# Patient Record
Sex: Male | Born: 1940 | Race: White | Hispanic: No | Marital: Married | Smoking: Never smoker
Health system: Southern US, Community
[De-identification: ages and names within clinical notes are randomized; demographics above are authoritative.]

## PROBLEM LIST (undated history)

## (undated) DIAGNOSIS — S0501XA Injury of conjunctiva and corneal abrasion without foreign body, right eye, initial encounter: Secondary | ICD-10-CM

## (undated) DIAGNOSIS — M1990 Unspecified osteoarthritis, unspecified site: Secondary | ICD-10-CM

## (undated) DIAGNOSIS — E7800 Pure hypercholesterolemia, unspecified: Secondary | ICD-10-CM

## (undated) DIAGNOSIS — I619 Nontraumatic intracerebral hemorrhage, unspecified: Secondary | ICD-10-CM

## (undated) DIAGNOSIS — Z91018 Allergy to other foods: Secondary | ICD-10-CM

## (undated) DIAGNOSIS — C801 Malignant (primary) neoplasm, unspecified: Secondary | ICD-10-CM

## (undated) HISTORY — DX: Injury of conjunctiva and corneal abrasion without foreign body, right eye, initial encounter: S05.01XA

## (undated) HISTORY — DX: Unspecified osteoarthritis, unspecified site: M19.90

## (undated) HISTORY — PX: CIRCUMCISION: SHX1350

## (undated) HISTORY — DX: Allergy to other foods: Z91.018

---

## 1990-01-15 HISTORY — PX: EYE SURGERY: SHX253

## 2004-01-28 ENCOUNTER — Ambulatory Visit: Payer: Self-pay | Admitting: Internal Medicine

## 2004-01-28 ENCOUNTER — Ambulatory Visit (HOSPITAL_COMMUNITY): Admission: RE | Admit: 2004-01-28 | Discharge: 2004-01-28 | Payer: Self-pay | Admitting: Internal Medicine

## 2004-11-28 ENCOUNTER — Ambulatory Visit: Payer: Self-pay | Admitting: Internal Medicine

## 2006-09-03 IMAGING — CR DG HIP W/ PELVIS BILAT
2 series · 2 of 2 positions shown · non-contrast
Comparison: none

CLINICAL DATA: Left hip pain with no known injury.
 BILATERAL HIPS WITH AP PELVIS:
 Two views of each hip as well as AP view of the pelvis were performed.  Mild symmetric degenerative changes are seen involving the hip joints. No evidence of acute fracture or dislocation. No bony lesions or erosions are identified.  The visualized bony pelvis is unremarkable.

[view not recorded (1 of 2)]
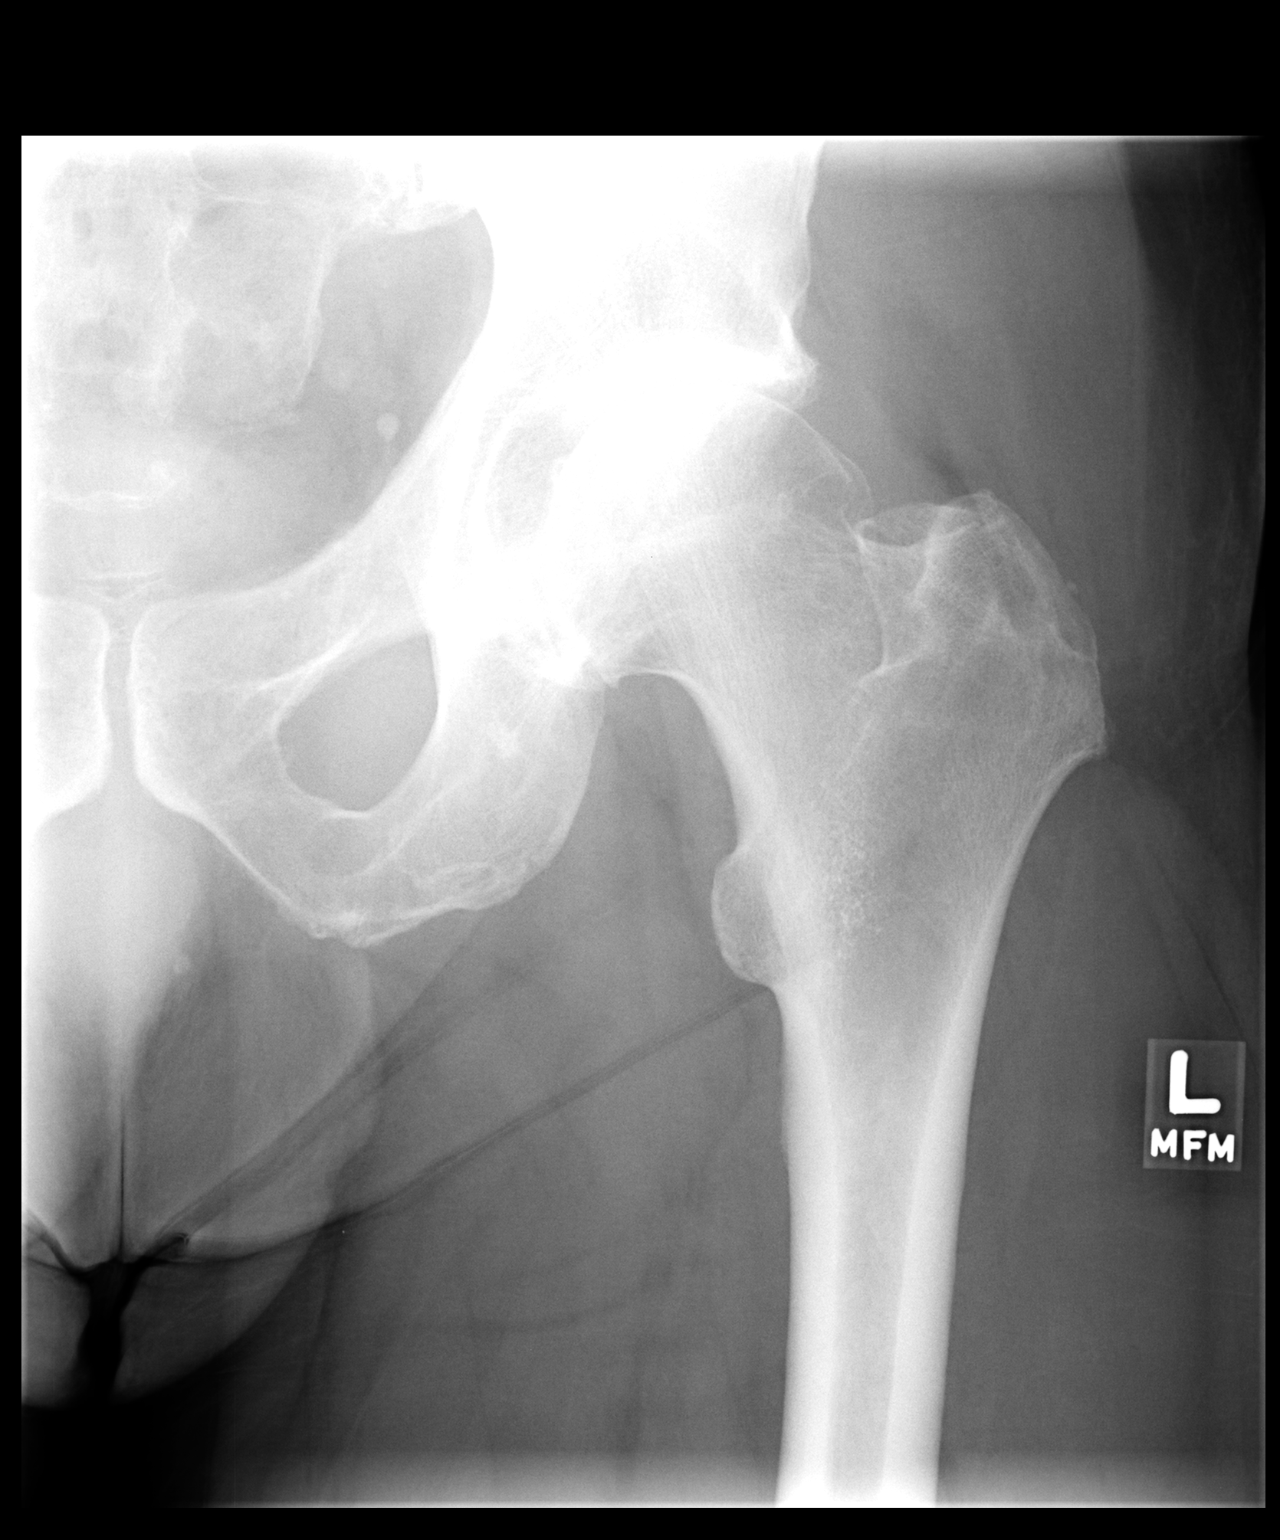

[view not recorded (2 of 2)]
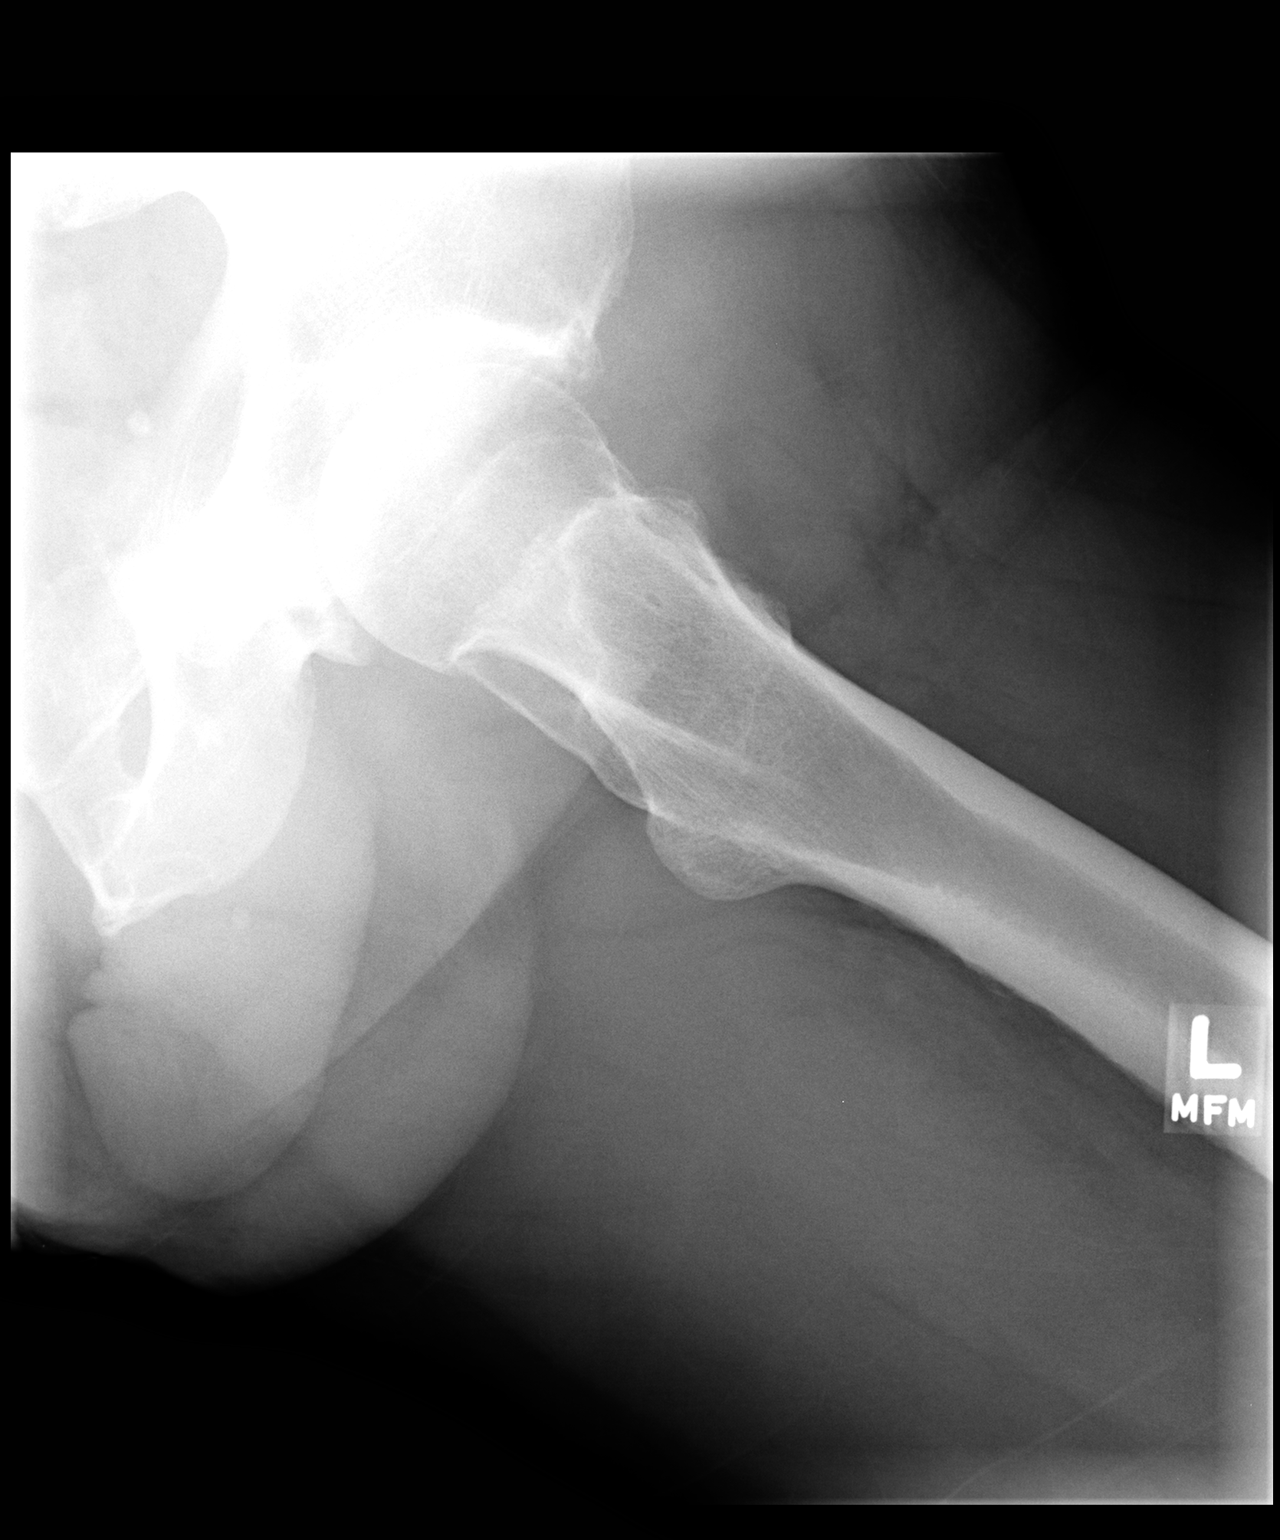

[2 of 2 positions shown; findings below may reference images not displayed]

IMPRESSION: Mild degenerative changes involving both hip joints. No acute abnormalities.

## 2006-09-03 IMAGING — CR DG HIP W/ PELVIS BILAT
3 series · 3 of 3 positions shown · non-contrast
Comparison: none

CLINICAL DATA: Left hip pain with no known injury.
 BILATERAL HIPS WITH AP PELVIS:
 Two views of each hip as well as AP view of the pelvis were performed.  Mild symmetric degenerative changes are seen involving the hip joints. No evidence of acute fracture or dislocation. No bony lesions or erosions are identified.  The visualized bony pelvis is unremarkable.

[view not recorded (1 of 3)]
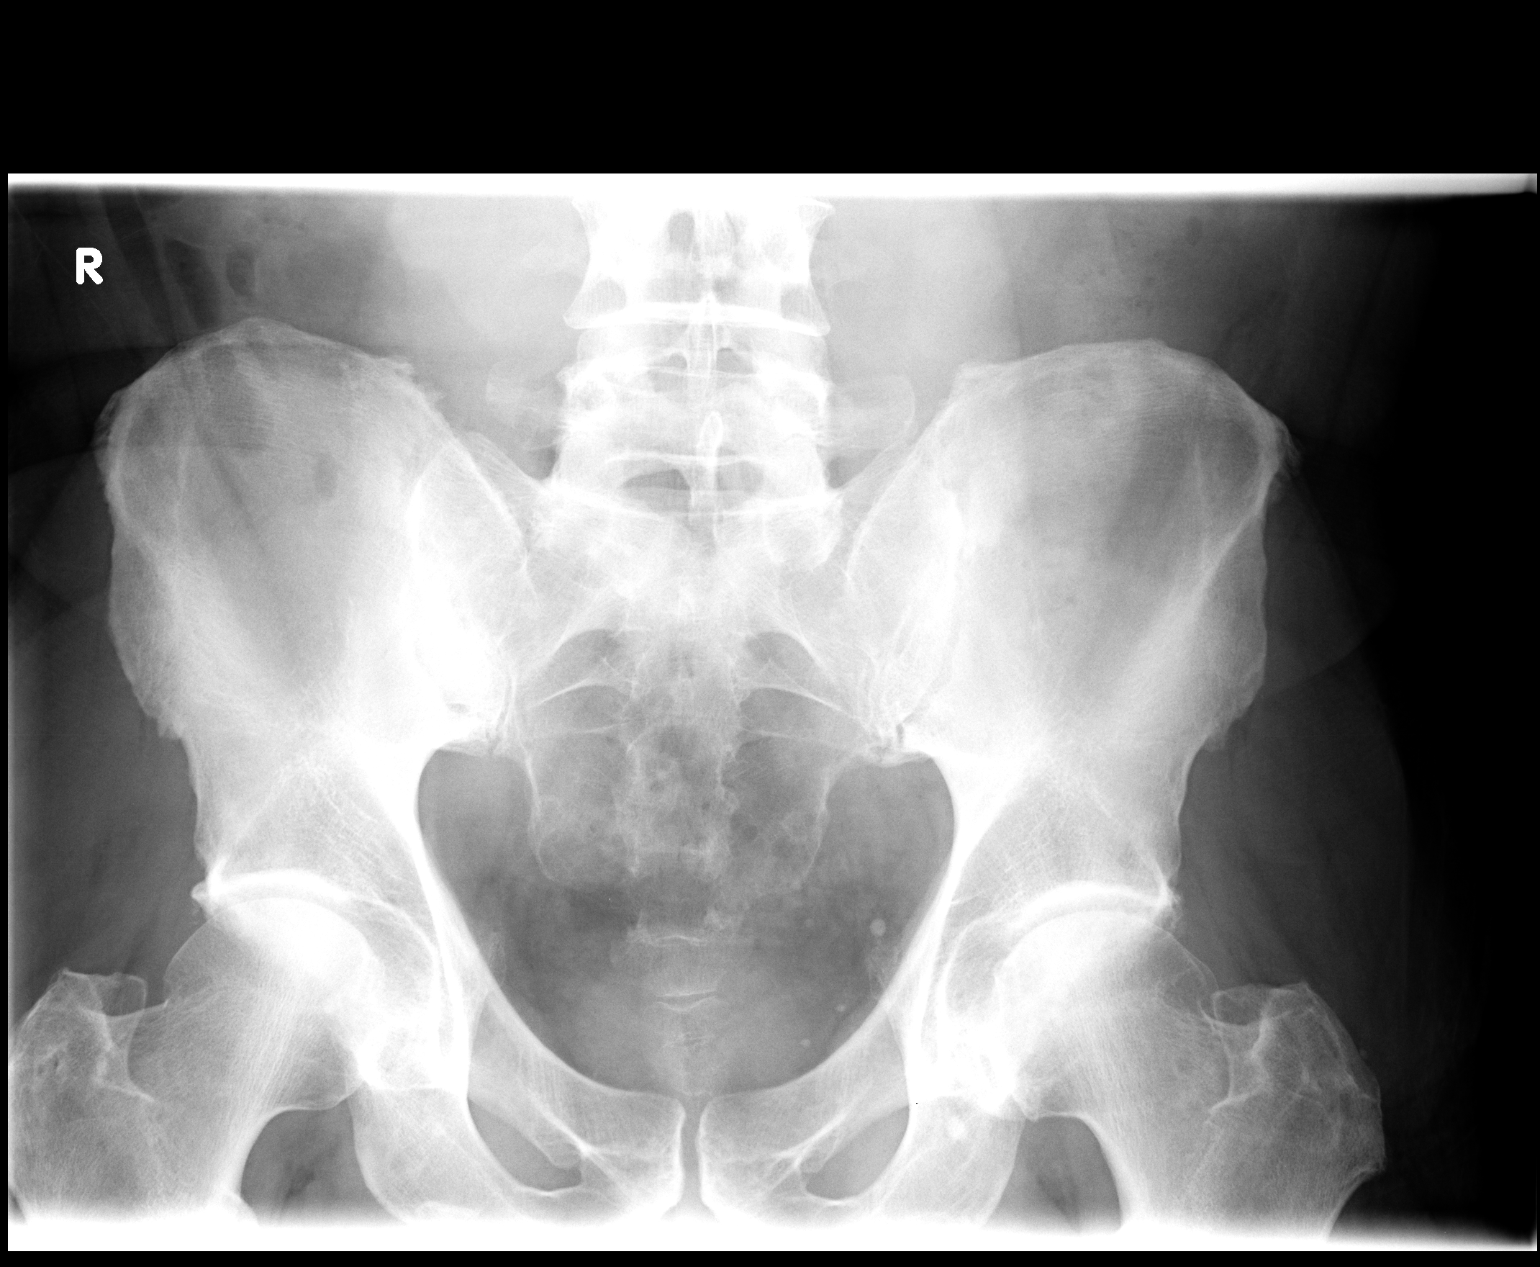

[view not recorded (2 of 3)]
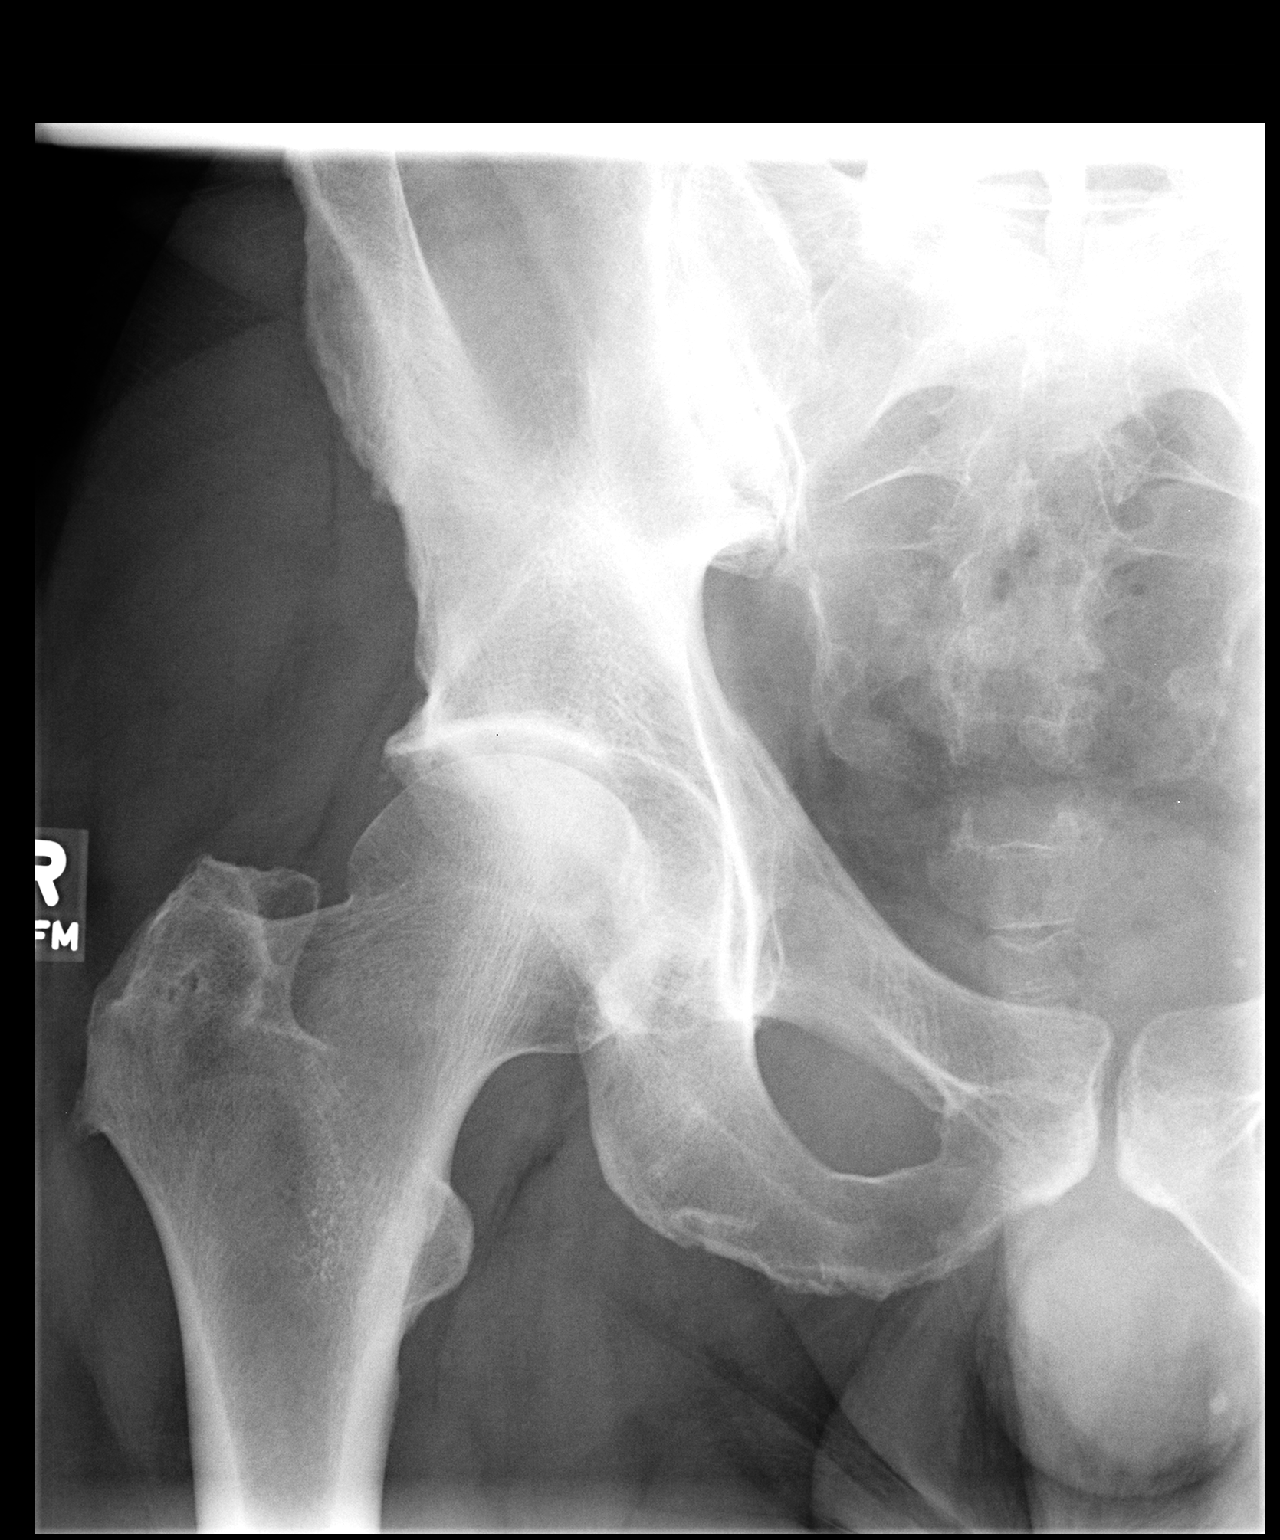

[view not recorded (3 of 3)]
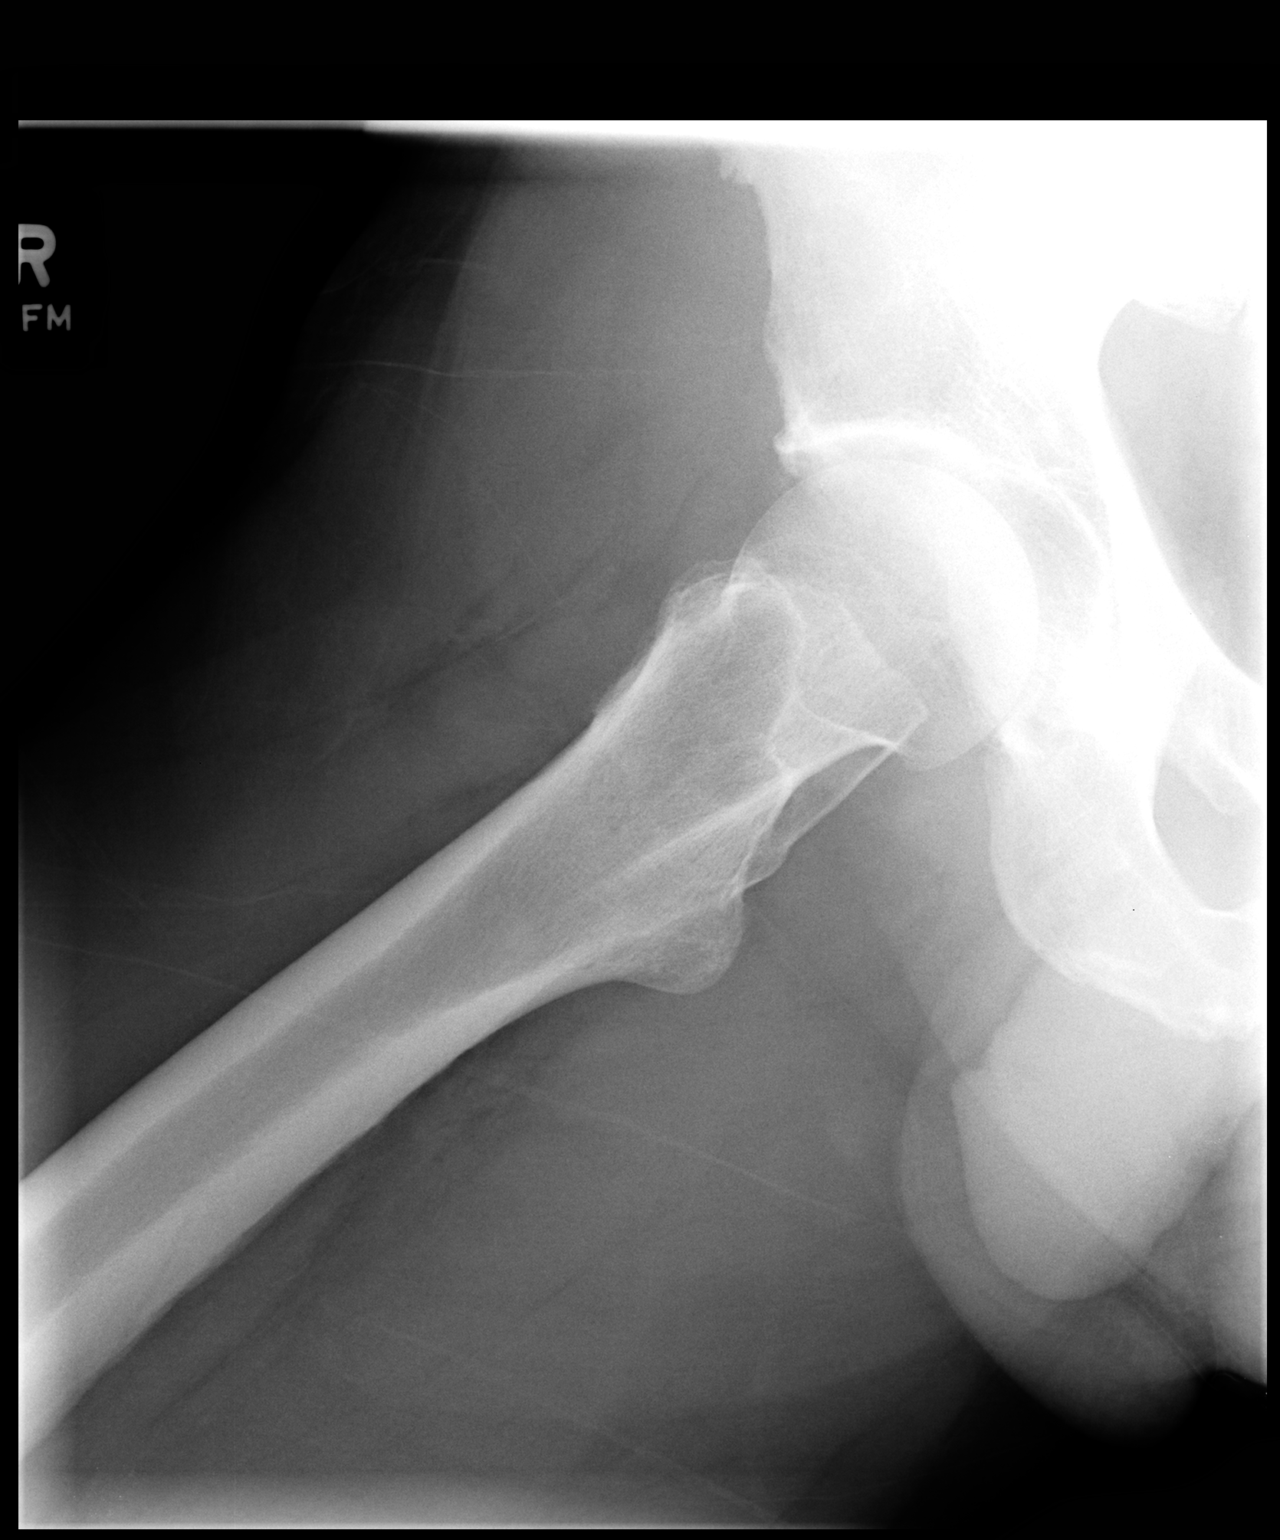

[3 of 3 positions shown; findings below may reference images not displayed]

IMPRESSION: Mild degenerative changes involving both hip joints. No acute abnormalities.

## 2010-01-15 DIAGNOSIS — S0501XA Injury of conjunctiva and corneal abrasion without foreign body, right eye, initial encounter: Secondary | ICD-10-CM

## 2010-01-15 HISTORY — DX: Injury of conjunctiva and corneal abrasion without foreign body, right eye, initial encounter: S05.01XA

## 2011-01-10 ENCOUNTER — Ambulatory Visit (INDEPENDENT_AMBULATORY_CARE_PROVIDER_SITE_OTHER): Payer: Medicare Other | Admitting: Internal Medicine

## 2011-01-10 ENCOUNTER — Other Ambulatory Visit (INDEPENDENT_AMBULATORY_CARE_PROVIDER_SITE_OTHER): Payer: Medicare Other

## 2011-01-10 ENCOUNTER — Other Ambulatory Visit: Payer: Self-pay | Admitting: Internal Medicine

## 2011-01-10 ENCOUNTER — Encounter: Payer: Self-pay | Admitting: Internal Medicine

## 2011-01-10 DIAGNOSIS — Z0000 Encounter for general adult medical examination without abnormal findings: Secondary | ICD-10-CM

## 2011-01-10 DIAGNOSIS — E785 Hyperlipidemia, unspecified: Secondary | ICD-10-CM

## 2011-01-10 DIAGNOSIS — M160 Bilateral primary osteoarthritis of hip: Secondary | ICD-10-CM

## 2011-01-10 DIAGNOSIS — Z125 Encounter for screening for malignant neoplasm of prostate: Secondary | ICD-10-CM

## 2011-01-10 DIAGNOSIS — N429 Disorder of prostate, unspecified: Secondary | ICD-10-CM

## 2011-01-10 DIAGNOSIS — M1610 Unilateral primary osteoarthritis, unspecified hip: Secondary | ICD-10-CM

## 2011-01-10 DIAGNOSIS — E559 Vitamin D deficiency, unspecified: Secondary | ICD-10-CM | POA: Insufficient documentation

## 2011-01-10 LAB — LIPID PANEL
Cholesterol: 248 mg/dL — ABNORMAL HIGH (ref 0–200)
HDL: 43.3 mg/dL (ref >39.00)
Total CHOL/HDL Ratio: 6
VLDL: 64.8 mg/dL — ABNORMAL HIGH (ref 0.0–40.0)

## 2011-01-10 LAB — CBC WITH DIFFERENTIAL/PLATELET
Basophils Relative: 0.6 % (ref 0.0–3.0)
Eosinophils Relative: 2.7 % (ref 0.0–5.0)
HCT: 40.9 % (ref 39.0–52.0)
Hemoglobin: 14 g/dL (ref 13.0–17.0)
Lymphs Abs: 2.3 10*3/uL (ref 0.7–4.0)
MCHC: 34.2 g/dL (ref 30.0–36.0)
MCV: 90.2 fl (ref 78.0–100.0)
Monocytes Absolute: 0.5 10*3/uL (ref 0.1–1.0)
Neutro Abs: 3.7 10*3/uL (ref 1.4–7.7)
Neutrophils Relative %: 55.3 % (ref 43.0–77.0)
RBC: 4.53 Mil/uL (ref 4.22–5.81)
WBC: 6.7 10*3/uL (ref 4.5–10.5)

## 2011-01-10 LAB — COMPREHENSIVE METABOLIC PANEL
ALT: 35 U/L (ref 0–53)
AST: 30 U/L (ref 0–37)
Alkaline Phosphatase: 58 U/L (ref 39–117)
BUN: 21 mg/dL (ref 6–23)
Creatinine, Ser: 1.2 mg/dL (ref 0.4–1.5)
Total Bilirubin: 1.4 mg/dL — ABNORMAL HIGH (ref 0.3–1.2)

## 2011-01-10 LAB — LDL CHOLESTEROL, DIRECT: Direct LDL: 117.1 mg/dL

## 2011-01-10 NOTE — Assessment & Plan Note (Signed)
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 He is able to do all his normal and desired activities. He reports that the pain is tolerable with the use of advil twice a day. Renal function is normal - no evidence of kidney damage from non-steroidal medication.  Plan - continue to be active - flexibility exercise and stretch           Continue with advil as presently dosed.

## 2011-01-10 NOTE — Progress Notes (Signed)
Subjective:    Patient ID: Eddie Walter, male    DOB: 10-Oct-1940, 70 y.o.   MRN: [id number]  HPI [name] presents to reestablish for primary care. He was last seen in '04 by dictated records, in '07 by his report. His chief complaint is ongoing hip pain. He does take  advil twice a day with some relief. He is otherwise healthy and does remain active physically.  The patient is here for annual Medicare wellness examination and management of other chronic and acute problems.   The risk factors are reflected in the social history.  The roster of all physicians providing medical care to patient - is listed in the Snapshot section of the chart.  Activities of daily living:  The patient is 100% inedpendent in all ADLs: dressing, toileting, feeding as well as independent mobility  Home safety : The patient has smoke detectors in the home. They wear seatbelts. firearms are present in the home, kept in a safe fashion. There is no violence in the home.   There is no risks for hepatitis, STDs or HIV. There is no   history of blood transfusion. They have no travel history to infectious disease endemic areas of the world.  The patient has seen their dentist in the last six month. They have seen their eye doctor in the last year. They admit to any hearing difficulty, mild on the right. Have not had audiologic testing in the last year.  They do not  have excessive sun exposure. Discussed the need for sun protection: hats, long sleeves and use of sunscreen if there is significant sun exposure.   Diet: the importance of a healthy diet is discussed. They do have a unhealthy-high fat/fast food at times (breakfast) diet, but does try to make healty choices.  The patient has a regular exercise program: elipitical and machines ,  45  duration,  3 per week.  The benefits of regular aerobic exercise were discussed.  Depression screen: there are no signs or vegative symptoms of depression- irritability,  change in appetite, anhedonia, sadness/tearfullness.  Cognitive assessment: the patient manages all their financial and personal affairs and is actively engaged. They could relate day,date,year and events.  The following portions of the patient's history were reviewed and updated as appropriate: allergies, current medications, past family history, past medical history,  past surgical history, past social history  and problem list.  Vision, hearing, body mass index were assessed and reviewed.   During the course of the visit the patient was educated and counseled about appropriate screening and preventive services including : fall prevention , diabetes screening, nutrition counseling, colorectal cancer screening, and recommended immunizations.  Past Medical History  Diagnosis Date  . 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 Corneal abrasion, right 2012    required prolonged treatment   Past Surgical History  Procedure Date  . Eye surgery 1992    cataract -bilateral  . Circumcision     adult @ 65   Family History  Problem Relation Age of Onset  . Heart disease Mother     CAD, bradycardia, PVD - leg amputation  . Asthma Mother   . Kidney disease Mother     nephrolithiasis  . Cancer Father     lung  . Cancer Sister     breast cancer - lumpectomy  . Diabetes Neg Hx   . Heart disease Brother     aneurysm  . Heart disease Brother  CAD/MI-fatal   History   Social History  . Marital Status: Married    Spouse Name: N/A    Number of Children: 2  . Years of Education: 16   Occupational History  . real Chief Technology Officer    Social History Main Topics  . Smoking status: Never Smoker   . Smokeless tobacco: Never Used  . Alcohol Use: 0.5 oz/week    1 drink(s) per week  . Drug Use: No  . Sexually Active: Yes -- Male partner(s)   Other Topics Concern  . Not on file   Social History Narrative   HSG, [employer] 4 years, BS Business degree [employer] - night school.  Married - '67. 2 sons - '69, '77; 2 grand-daughters. Work - Engineer, civil (consulting). Has had hard times in recent recession but is making a good comeback (Dec '12). Marriage in good health. Exercises on a regular basis.         Review of Systems Constitutional:  Negative for fever, chills, activity change and unexpected weight change.  HEENT:  Negative for hearing loss, ear pain, congestion, neck stiffness and postnasal drip. Negative for sore throat or swallowing problems. Negative for dental complaints.   Eyes: Negative for vision loss or change in visual acuity.  Respiratory: Negative for chest tightness and wheezing. Negative for DOE.   Cardiovascular: Negative for chest pain or palpitations. No decreased exercise tolerance Gastrointestinal: No change in bowel habit. No bloating or gas. No reflux or indigestion Genitourinary: Negative for urgency, frequency, flank pain and difficulty urinating.  Musculoskeletal: Negative for myalgias, back pain, arthralgias and gait problem.  Neurological: Negative for dizziness, tremors, weakness and headaches.  Hematological: Negative for adenopathy.  Psychiatric/Behavioral: Negative for behavioral problems and dysphoric mood.       Objective:   Physical Exam Vital signs reviewed-stable Gen'l: Well nourished well developed white male in no acute distress  HEENT: Head: Normocephalic and atraumatic. Right Ear: External ear normal. EAC/TM nl. Left Ear: External ear normal.  EAC/TM nl. Nose: Nose normal. Mouth/Throat: Oropharynx is clear and moist. Dentition - native, in good repair. No buccal or palatal lesions. Posterior pharynx clear. Eyes: Conjunctivae and sclera clear. EOM intact. Pupils are equal, round, and reactive to light. Right eye exhibits no discharge. Left eye exhibits no discharge. Neck: Normal range of motion. Neck supple. No JVD present. No tracheal deviation present. No thyromegaly present.  Cardiovascular: Normal rate, regular rhythm,  no gallop, no friction rub, no murmur heard.      Quiet precordium. 2+ radial and DP pulses . No carotid bruits Pulmonary/Chest: Effort normal. No respiratory distress or increased WOB, no wheezes, no rales. No chest wall deformity or CVAT. Abdominal: Soft. Bowel sounds are normal in all quadrants. He exhibits no distension, no tenderness, no rebound or guarding, No heptosplenomegaly  Genitourinary:  deferred to PSA order Musculoskeletal: Normal range of motion. He exhibits no edema and no tenderness.       Small and large joints without redness, synovial thickening or deformity. Full range of motion preserved about all small, median and large joints.  Lymphadenopathy:    He has no cervical or supraclavicular adenopathy.  Neurological: He is alert and oriented to person, place, and time. CN II-XII intact. DTRs 2+ and symmetrical biceps, radial and patellar tendons. Cerebellar function normal with no tremor, rigidity, normal gait and station.  Skin: Skin is warm and dry. No rash noted. No erythema.  Psychiatric: He has a normal mood and affect. His behavior is  normal. Thought content normal.   Lab Results  Component Value Date   WBC 6.7 01/10/2011   HGB 14.0 01/10/2011   HCT 40.9 01/10/2011   PLT 195.0 01/10/2011   GLUCOSE 108* 01/10/2011   CHOL 248* 01/10/2011   TRIG 324.0* 01/10/2011   HDL 43.30 01/10/2011   LDLDIRECT 117.1 01/10/2011   ALT 35 01/10/2011   AST 30 01/10/2011   NA 141 01/10/2011   K 4.7 01/10/2011   CL 105 01/10/2011   CREATININE 1.2 01/10/2011   BUN 21 01/10/2011   CO2 30 01/10/2011   TSH 1.02 01/10/2011   PSA 3.74 01/10/2011         Assessment & Plan:

## 2011-01-10 NOTE — Assessment & Plan Note (Signed)
Interval medical history since last visit is unremarkable. Physical exam is normal. Lab results are in normal range except for total cholesterol and triglycerides. No record of colorectal cancer screening. May have been done at [facility] - if not he is due for colonoscopy. Prostate cancer screening - normal PSA today. Immunization: he will check on status re: tetanus and shingles vaccine. He is due for flu vaccine.  In summary- a nice man who presents to reestablish for care. He is medically stable but is advised to work on diet management of cholesterol and to be sure we have current data on colorectal cancer screening and immunization status.

## 2011-01-10 NOTE — Assessment & Plan Note (Signed)
Lab Results  Component Value Date   CHOL 248* 01/10/2011   HDL 43.30 01/10/2011   LDLDIRECT 117.1 01/10/2011   TRIG 324.0* 01/10/2011   CHOLHDL 6 01/10/2011   Total cholesterol is high but LDL is better than goal of 130 or less; HDL is better than goal of 39 or more.   Plan - no indication for medical therapy           Life-style management with a low fat diet and continued exercise

## 2011-01-10 NOTE — Assessment & Plan Note (Signed)
Previously diagnosed. No risk factors for low D. [facility] blocks ordering routine follow-up lab.  Plan - continue over the counter supplement: [id number] iu daily

## 2012-02-12 ENCOUNTER — Ambulatory Visit (INDEPENDENT_AMBULATORY_CARE_PROVIDER_SITE_OTHER): Payer: Medicare Other | Admitting: Internal Medicine

## 2012-02-12 ENCOUNTER — Other Ambulatory Visit (INDEPENDENT_AMBULATORY_CARE_PROVIDER_SITE_OTHER): Payer: Medicare Other

## 2012-02-12 ENCOUNTER — Encounter: Payer: Self-pay | Admitting: Internal Medicine

## 2012-02-12 VITALS — BP 142/82 | HR 47 | Temp 98.3°F | Ht 70.0 in | Wt 208.2 lb

## 2012-02-12 DIAGNOSIS — M160 Bilateral primary osteoarthritis of hip: Secondary | ICD-10-CM

## 2012-02-12 DIAGNOSIS — M1610 Unilateral primary osteoarthritis, unspecified hip: Secondary | ICD-10-CM

## 2012-02-12 DIAGNOSIS — Z136 Encounter for screening for cardiovascular disorders: Secondary | ICD-10-CM

## 2012-02-12 DIAGNOSIS — Z0000 Encounter for general adult medical examination without abnormal findings: Secondary | ICD-10-CM

## 2012-02-12 DIAGNOSIS — Z23 Encounter for immunization: Secondary | ICD-10-CM

## 2012-02-12 LAB — COMPREHENSIVE METABOLIC PANEL
ALT: 32 U/L (ref 0–53)
CO2: 27 mEq/L (ref 19–32)
Calcium: 9.4 mg/dL (ref 8.4–10.5)
Chloride: 106 mEq/L (ref 96–112)
Creatinine, Ser: 1.1 mg/dL (ref 0.4–1.5)
GFR: 71.08 mL/min (ref >60.00)
Glucose, Bld: 98 mg/dL (ref 70–99)

## 2012-02-12 NOTE — Progress Notes (Signed)
Subjective:    Patient ID: CID AGENA, male    DOB: [birth year], 72 y.o.   MRN: [id number]  HPI The patient is here for annual Medicare wellness examination and management of other chronic and acute problems. Doing well in the last year: no major illness, no surgery or injury.   The risk factors are reflected in the social history.  The roster of all physicians providing medical care to patient - is listed in the Snapshot section of the chart.  Activities of daily living:  The patient is 100% inedpendent in all ADLs: dressing, toileting, feeding as well as independent mobility  Home safety : The patient has smoke detectors in the home. Fall- home is fall safe. They wear seatbelts. firearms are present in the home, kept in a safe fashion. There is no violence in the home.   There is no risks for hepatitis, STDs or HIV. There is no  history of blood transfusion. They have no travel history to infectious disease endemic areas of the world.  The patient has  seen their dentist in the last six month. They have not seen their eye doctor in the last year. They admit to hearing difficulty and have not had audiologic testing in the last year.    They do not  have excessive sun exposure. Discussed the need for sun protection: hats, long sleeves and use of sunscreen if there is significant sun exposure.   Diet: the importance of a healthy diet is discussed. They do have a healthy  diet.  The patient has a regular exercise program: ellipitcal/weight work , 60-75 min duration, 5 per week.  The benefits of regular aerobic exercise were discussed.  Depression screen: there are no signs or vegative symptoms of depression- irritability, change in appetite, anhedonia, sadness/tearfullness.  Cognitive assessment: the patient manages all their financial and personal affairs and is actively engaged.   Past Medical History  Diagnosis Date  . 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 Corneal abrasion,  right 2012    required prolonged treatment   Past Surgical History  Procedure Date  . Eye surgery 1992    cataract -bilateral  . Circumcision     adult @ 89   Family History  Problem Relation Age of Onset  . Heart disease Mother     CAD, bradycardia, PVD - leg amputation  . Asthma Mother   . Kidney disease Mother     nephrolithiasis  . Cancer Father     lung  . Cancer Sister     breast cancer - lumpectomy  . Diabetes Neg Hx   . Heart disease Brother     aneurysm  . Heart disease Brother     CAD/MI-fatal   History   Social History  . Marital Status: Married    Spouse Name: N/A    Number of Children: 2  . Years of Education: 16   Occupational History  . real Chief Technology Officer    Social History Main Topics  . Smoking status: Never Smoker   . Smokeless tobacco: Never Used  . Alcohol Use: 0.5 oz/week    1 drink(s) per week  . Drug Use: No  . Sexually Active: Yes -- Male partner(s)   Other Topics Concern  . Not on file   Social History Narrative   HSG, [employer] 4 years, BS Business degree [employer] - night school. Married - '67. 2 sons - '69, '77; 2 grand-daughters. 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 Has had hard times in recent recession but is making a good comeback (Dec '12). Marriage in good health. Exercises on a regular basis.     Current Outpatient Prescriptions on File Prior to Visit  Medication Sig Dispense Refill  . Cyanocobalamin (VITAMIN B-12 CR) 1000 MCG TBCR Take 1 tablet by mouth daily.        . fish oil-omega-3 fatty acids 1000 MG capsule Take 2 g by mouth daily.        Marland Kitchen glucosamine-chondroitin 500-400 MG tablet Take 1 tablet by mouth 2 (two) times daily.        . Naproxen Sodium (ALEVE) 220 MG CAPS Take 1 capsule by mouth 2 (two) times daily.        . Nutritional Supplements (VITAMIN D MAINTENANCE PO) Take 1 tablet by mouth daily.          The following portions of the patient's history were reviewed and updated as appropriate:  allergies, current medications, past family history, past medical history,  past surgical history, past social history  and problem list.  Vision, hearing, body mass index were assessed and reviewed.   During the course of the visit the patient was educated and counseled about appropriate screening and preventive services including : fall prevention , diabetes screening, nutrition counseling, colorectal cancer screening, and recommended immunizations.    Review of Systems Constitutional:  Negative for fever, chills, activity change and unexpected weight change.  HEENT:  Negative for hearing loss, ear pain, congestion, neck stiffness and postnasal drip. Negative for sore throat or swallowing problems. Negative for dental complaints.   Eyes: Negative for vision loss or change in visual acuity.  Respiratory: Negative for chest tightness and wheezing. Negative for DOE.   Cardiovascular: Negative for chest pain or palpitations. No decreased exercise tolerance Gastrointestinal: No change in bowel habit. No bloating or gas. No reflux or indigestion Genitourinary: Negative for urgency, frequency, flank pain and difficulty urinating.  Musculoskeletal: Negative for myalgias, back pain, arthralgias and gait problem.  Neurological: Negative for dizziness, tremors, weakness and headaches.  Hematological: Negative for adenopathy.  Psychiatric/Behavioral: Negative for behavioral problems and dysphoric mood.       Objective:   Physical Exam Filed Vitals:   02/12/12 1403  BP: 142/82  Pulse: 47  Temp: 98.3 F (36.8 C)   Wt Readings from Last 3 Encounters:  02/12/12 208 lb 3.2 oz (94.439 kg)  01/10/11 211 lb (95.709 kg)   Gen'l: Well nourished well developed     male in no acute distress  HEENT: Head: Normocephalic and atraumatic. Right Ear: External ear normal. EAC/TM nl. Left Ear: External ear normal.  EAC/TM nl. Nose: Nose normal. Mouth/Throat: Oropharynx is clear and moist. Dentition - native,  in good repair. No buccal or palatal lesions. Posterior pharynx clear. Eyes: Conjunctivae and sclera clear. EOM intact. Pupils are equal, round, and reactive to light. Right eye exhibits no discharge. Left eye exhibits no discharge. Neck: Normal range of motion. Neck supple. No JVD present. No tracheal deviation present. No thyromegaly present.  Cardiovascular: Normal rate, regular rhythm, no gallop, no friction rub, no murmur heard.      Quiet precordium. 2+ radial and DP pulses . No carotid bruits Pulmonary/Chest: Effort normal. No respiratory distress or increased WOB, no wheezes, no rales. No chest wall deformity or CVAT. Abdomen: Soft. Bowel sounds are normal in all quadrants. He exhibits no distension, no tenderness, no rebound or guarding, No heptosplenomegaly  Genitourinary:   Musculoskeletal: Normal range  of motion. He exhibits no edema and no tenderness.       Small and large joints without redness, synovial thickening or deformity. Full range of motion preserved about all small, median and large joints.  Lymphadenopathy:    He has no cervical or supraclavicular adenopathy.  Neurological: He is alert and oriented to person, place, and time. CN II-XII intact. DTRs 2+ and symmetrical biceps, radial and patellar tendons. Cerebellar function normal with no tremor, rigidity, normal gait and station.  Skin: Skin is warm and dry. No rash noted. No erythema.  Psychiatric: He has a normal mood and affect. His behavior is normal. Thought content normal.         Assessment & Plan:

## 2012-02-12 NOTE — Patient Instructions (Addendum)
Thanks for coming in.  Your exam is good.  Please check on the colonoscopy report as to the type of polyp so we can schedule appropriate follow up  Limited lab today. Your cholesterol has been stable and the LDL was 117 which is in normal range.  Check on coverage for shingles vaccine and check on the date of your last tetanus.  Come back in a year, sooner if needed.

## 2012-11-20 ENCOUNTER — Other Ambulatory Visit: Payer: Self-pay

## 2013-04-22 NOTE — Assessment & Plan Note (Signed)
Normal medicare wellness exam.

## 2014-07-12 ENCOUNTER — Other Ambulatory Visit: Payer: Self-pay

## 2015-09-14 ENCOUNTER — Encounter (HOSPITAL_COMMUNITY): Payer: Self-pay

## 2015-09-14 ENCOUNTER — Emergency Department (HOSPITAL_COMMUNITY)
Admission: EM | Admit: 2015-09-14 | Discharge: 2015-09-14 | Disposition: A | Discharge status: Home / Self Care | Payer: Medicare Other | Attending: Emergency Medicine | Admitting: Emergency Medicine

## 2015-09-14 DIAGNOSIS — R220 Localized swelling, mass and lump, head: Secondary | ICD-10-CM | POA: Diagnosis not present

## 2015-09-14 MED ORDER — PREDNISONE 10 MG (21) PO TBPK: 10.0000 mg | ORAL_TABLET | Freq: Every day | ORAL | 0 refills | Status: DC

## 2015-09-14 NOTE — Discharge Instructions (Signed)
Please call your primary doctor at the [place] to schedule a follow-up.   If you are concerned about swelling of the face or in the mouth again, or have troubles breathing, call 911 or return to the ED.  I have prescribed a course of prednisone of decreasing dose for the next 12 days.  Please take as directed.  Also, continue to take Benadryl 25 mg every 6 hours and for the next 2 days.

## 2015-09-14 NOTE — ED Provider Notes (Signed)
[facility] DEPT Provider Note   CSN: [id number] Arrival date & time: 09/14/15  1457     History   Chief Complaint Chief Complaint  Patient presents with  . Oral Swelling    HP Eddie Walter is a healthy 75 y.o. male who presents after an episode of tongue swelling this afternoon that did not compromise his breathing or swallowing.  This morning, about 2 hours prior to presentation he noticed his tongue being to swell, with a hard lump on the left side of his tongue.  He did not have any dyspnea, dysphagia, drooling, or change in voice.  He used an EpiPen, took a benadryl, and took 1x 4 mg prednisone he had left over from his last episode of swelling.  No new meds, supplements, or foods.  He has had 2 prior episodes of tongue swelling, once last month and once 10 months ago.  Associated perioral edema previously.  HPI  Past Medical History:  Diagnosis Date  . Arthritis '07   hip DJD by x-ray  . Corneal abrasion, right 2012   required prolonged treatment    Patient Active Problem List   Diagnosis Date Noted  . Hyperlipidemia 01/10/2011  . 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 Vitamin D deficiency 01/10/2011  . Routine health maintenance 01/10/2011    Past Surgical History:  Procedure Laterality Date  . CIRCUMCISION     adult @ 4  . EYE SURGERY  1992   cataract -bilateral       Home Medications    Prior to Admission medications   Medication Sig Start Date End Date Taking? Authorizing Provider  Cyanocobalamin (VITAMIN B-12 CR) 1000 MCG TBCR Take 1 tablet by mouth daily.      Historical Provider, MD  fish oil-omega-3 fatty acids 1000 MG capsule Take 2 g by mouth daily.      Historical Provider, MD  glucosamine-chondroitin 500-400 MG tablet Take 1 tablet by mouth 2 (two) times daily.      Historical Provider, MD  Naproxen Sodium (ALEVE) 220 MG CAPS Take 1 capsule by mouth 2 (two) times daily.      Historical Provider, MD  Nutritional  Supplements (VITAMIN D MAINTENANCE PO) Take 1 tablet by mouth daily.      Historical Provider, MD    Family History Family History  Problem Relation Age of Onset  . Heart disease Mother     CAD, bradycardia, PVD - leg amputation  . Asthma Mother   . Kidney disease Mother     nephrolithiasis  . Cancer Father     lung  . Cancer Sister     breast cancer - lumpectomy  . Heart disease Brother     aneurysm  . Heart disease Brother     CAD/MI-fatal  . Diabetes Neg Hx     Social History Social History  Substance Use Topics  . Smoking status: Never Smoker  . Smokeless tobacco: Never Used  . Alcohol use 0.5 oz/week    1 Standard drinks or equivalent per week     Allergies   Review of patient's allergies indicates no known allergies.  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  Review of Systems Review of Systems  Constitutional: Negative for fever.  HENT: Positive for facial swelling. Negative for drooling, trouble swallowing and voice change.   Respiratory: Negative for cough, chest tightness, shortness of breath and wheezing.   Cardiovascular: Negative for chest pain and palpitations.  Gastrointestinal: Negative for  abdominal pain, diarrhea, nausea and vomiting.  Genitourinary: Negative for dysuria and hematuria.  Musculoskeletal: Negative for arthralgias and myalgias.  Skin: Negative for rash.  Allergic/Immunologic: Positive for food allergies.       Blood test indicating pork and beef allergies?  Neurological: Negative for weakness and headaches.     Physical Exam Updated Vital Signs BP 147/79 (BP Location: Right Arm)   Pulse 98   Temp 97.6 F (36.4 C) (Oral)   Resp 20   Ht 5\' 10"  (1.778 m)   Wt 88.9 kg   SpO2 97%   BMI 28.12 kg/m   Physical Exam  Constitutional: He is oriented to person, place, and time. He appears well-developed and well-nourished. No distress.  HENT:  No lingual or pharyngeal edema.  No lesions or erythema.  Eyes:  Conjunctivae are normal. No scleral icterus.  Neck: Normal range of motion. Neck supple.  Cardiovascular: Normal rate and regular rhythm.   Pulmonary/Chest: Effort normal and breath sounds normal. No stridor. No respiratory distress.  Abdominal: Soft. He exhibits no distension. There is no tenderness.  Musculoskeletal: He exhibits no edema or deformity.  Lymphadenopathy:    He has no cervical adenopathy.  Neurological: He is alert and oriented to person, place, and time.  Skin: Skin is warm and dry. No rash noted.  Psychiatric: He has a normal mood and affect. His behavior is normal.     ED Treatments / Results  Labs (all labs ordered are listed, but only abnormal results are displayed) Labs Reviewed - No data to display  EKG  EKG Interpretation None       Radiology No results found.  Procedures Procedures (including critical care time)  Medications Ordered in ED Medications - No data to display   Initial Impression / Assessment and Plan / ED Course  I have reviewed the triage vital signs and the nursing notes.  Pertinent labs & imaging results that were available during my care of the patient were reviewed by me and considered in my medical decision making (see chart for details).  Patient with history of episodic facial/oral swelling concerning for angioedema.  Given the rapid onset and resolution of symptoms, other causes of edema such as infection, autoimmune disease, and irritant dermatitis/mucositis are unlikely.   Symptoms resolved by ED presentation after home epinephrine, diphenhydramine, and small dose of prednisone.  No airway threat.  -Observe for rebound edema  Clinical Course     Final Clinical Impressions(s) / ED Diagnoses   Final diagnoses:  Tongue swelling   Discharge home with return precautions -PO prednisone taper -Has Epipen at home -Continue Q6H benadryl for next 2 days -Follow-up with PCP at [facility] Prescriptions Discharge Medication  List as of 09/14/2015  4:29 PM    START taking these medications   Details  predniSONE (STERAPRED UNI-PAK 21 TAB) 10 MG (21) TBPK tablet Take 1 tablet (10 mg total) by mouth daily. Take 6 tabs by mouth daily  for 2 days, then 5 tabs for 2 days, then 4 tabs for 2 days, then 3 tabs for 2 days, 2 tabs for 2 days, then 1 tab by mouth daily for 2 days, Starting Wed 09/14/2015, Print         Minus Liberty, MD 09/14/15 Baker, DO 09/15/15 [phone]

## 2015-09-14 NOTE — ED Triage Notes (Signed)
Per Pt, Pt is coming from home with complaints of oral swelling. Pt had an episode of oral swelling from an unknown source today. Pt took Benadryl and medication given by MD without relief. Called MD and MD advised to take Epipen. Pt reports HX of facial swelling and one episode of oral swelling. Alert and oriented x4 upon arrival. Easily swallowing secretions.

## 2015-10-06 ENCOUNTER — Encounter (HOSPITAL_COMMUNITY): Payer: Self-pay | Admitting: Emergency Medicine

## 2015-10-06 ENCOUNTER — Emergency Department (HOSPITAL_COMMUNITY)
Admission: EM | Admit: 2015-10-06 | Discharge: 2015-10-07 | Disposition: A | Discharge status: Home / Self Care | Payer: Medicare Other | Attending: Emergency Medicine | Admitting: Emergency Medicine

## 2015-10-06 DIAGNOSIS — R220 Localized swelling, mass and lump, head: Secondary | ICD-10-CM | POA: Insufficient documentation

## 2015-10-06 DIAGNOSIS — T7849XA Other allergy, initial encounter: Secondary | ICD-10-CM | POA: Insufficient documentation

## 2015-10-06 DIAGNOSIS — Z7982 Long term (current) use of aspirin: Secondary | ICD-10-CM | POA: Insufficient documentation

## 2015-10-06 DIAGNOSIS — T7840XA Allergy, unspecified, initial encounter: Secondary | ICD-10-CM

## 2015-10-06 MED ORDER — LORATADINE 10 MG PO TABS: 10.0000 mg | ORAL_TABLET | Freq: Every day | ORAL | 0 refills | Status: DC

## 2015-10-06 MED ORDER — FAMOTIDINE 20 MG PO TABS: 20.0000 mg | ORAL_TABLET | Freq: Two times a day (BID) | ORAL | 0 refills | Status: DC

## 2015-10-06 MED ORDER — LORATADINE 10 MG PO TABS
10.0000 mg | ORAL_TABLET | Freq: Once | ORAL | Status: AC
  Administered 2015-10-07: 10 mg via ORAL
  Filled 2015-10-06: qty 1

## 2015-10-06 MED ORDER — PREDNISONE 10 MG PO TABS: 20.0000 mg | ORAL_TABLET | Freq: Every day | ORAL | 0 refills | Status: DC

## 2015-10-06 MED ORDER — FAMOTIDINE 20 MG PO TABS
20.0000 mg | ORAL_TABLET | Freq: Once | ORAL | Status: AC
  Administered 2015-10-07: 20 mg via ORAL
  Filled 2015-10-06: qty 1

## 2015-10-06 MED ORDER — PREDNISONE 20 MG PO TABS
60.0000 mg | ORAL_TABLET | Freq: Once | ORAL | Status: AC
  Administered 2015-10-07: 60 mg via ORAL
  Filled 2015-10-06: qty 3

## 2015-10-06 NOTE — ED Provider Notes (Signed)
[facility] DEPT Provider Note   CSN: [id number] Arrival date & time: 10/06/15  1604     History   Chief Complaint Chief Complaint  Patient presents with  . Allergic Reaction    HPI Eddie Walter is a 75 y.o. male.  HPI   Patient has PMH of allergic reaction, arthritis, corneal abrasion. He reports not knowing what he is allergic too and today have an episode of an area of swelling to his mouth and tongue at home. He rook some benadryl and then called his PCP who advised him to use his Epi Pen. His epi pen was used around 1515, he then came to the ER. He reports complete resolution of symptoms within 2 hours. He has been in the ER for 7 hours and is symptom free.  Denies having had rash, diarrhea, vomiting, wheezing, SOB, or CP. He reports having 3 epi pens left at home if he needs.  Past Medical History:  Diagnosis Date  . Arthritis '07   hip DJD by x-ray  . Corneal abrasion, right 2012   required prolonged treatment    Patient Active Problem List   Diagnosis Date Noted  . Hyperlipidemia 01/10/2011  . 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 Vitamin D deficiency 01/10/2011  . Routine health maintenance 01/10/2011    Past Surgical History:  Procedure Laterality Date  . CIRCUMCISION     adult @ 5  . EYE SURGERY  1992   cataract -bilateral    Home Medications    Prior to Admission medications   Medication Sig Start Date End Date Taking? Authorizing Provider  aspirin EC 81 MG tablet Take 81 mg by mouth daily.   Yes Historical Provider, MD  cetirizine (ZYRTEC) 10 MG tablet Take 10 mg by mouth daily.   Yes Historical Provider, MD  diphenhydrAMINE (BENADRYL) 25 MG tablet Take 25 mg by mouth every 6 (six) hours as needed for allergies.   Yes Historical Provider, MD  EPINEPHrine 0.3 mg/0.3 mL IJ SOAJ injection Inject 0.3 mg into the muscle as needed (for allergic reaction).  09/27/15  Yes Historical Provider, MD  glucosamine-chondroitin 500-400 MG  tablet Take 1 tablet by mouth 2 (two) times daily.     Yes Historical Provider, MD  famotidine (PEPCID) 20 MG tablet Take 1 tablet (20 mg total) by mouth 2 (two) times daily. 10/06/15   Candia Kingsbury Carlota Raspberry, PA-C  loratadine (CLARITIN) 10 MG tablet Take 1 tablet (10 mg total) by mouth daily. 10/06/15   Deanza Upperman Carlota Raspberry, PA-C  predniSONE (DELTASONE) 10 MG tablet Take 2 tablets (20 mg total) by mouth daily. 10/06/15   Janan Bogie Carlota Raspberry, PA-C  predniSONE (STERAPRED UNI-PAK 21 TAB) 10 MG (21) TBPK tablet Take 1 tablet (10 mg total) by mouth daily. Take 6 tabs by mouth daily  for 2 days, then 5 tabs for 2 days, then 4 tabs for 2 days, then 3 tabs for 2 days, 2 tabs for 2 days, then 1 tab by mouth daily for 2 days Patient not taking: Reported on 10/06/2015 09/14/15   Minus Liberty, MD    Family History Family History  Problem Relation Age of Onset  . Heart disease Mother     CAD, bradycardia, PVD - leg amputation  . Asthma Mother   . Kidney disease Mother     nephrolithiasis  . Cancer Father     lung  . Cancer Sister     breast cancer - lumpectomy  . Heart disease Brother  aneurysm  . Heart disease Brother     CAD/MI-fatal  . Diabetes Neg Hx     Social History Social History  Substance Use Topics  . Smoking status: Never Smoker  . Smokeless tobacco: Never Used  . Alcohol use 0.5 oz/week    1 Standard drinks or equivalent per week     Allergies   Review of patient's allergies indicates no known allergies.   Review of Systems Review of Systems Review of Systems All other systems negative except as documented in the HPI. All pertinent positives and negatives as reviewed in the HPI.   Physical Exam Updated Vital Signs BP 186/82 (BP Location: Right Arm)   Pulse (!) 58   Temp 98.1 F (36.7 C) (Oral)   Resp 14   SpO2 99%   Physical Exam  Constitutional: He appears well-developed and well-nourished. No distress.  HENT:  Head: Normocephalic and atraumatic.  Mouth/Throat: Uvula is  midline, oropharynx is clear and moist and mucous membranes are normal.  Eyes: Pupils are equal, round, and reactive to light.  Neck: Normal range of motion. Neck supple.  Cardiovascular: Normal rate and regular rhythm.   Pulmonary/Chest: Effort normal and breath sounds normal. No apnea and no tachypnea. No respiratory distress.  Abdominal: Soft. Bowel sounds are normal. There is no tenderness.  Neurological: He is alert.  Skin: Skin is warm and dry.  Nursing note and vitals reviewed.    ED Treatments / Results  Labs (all labs ordered are listed, but only abnormal results are displayed) Labs Reviewed - No data to display  EKG  EKG Interpretation None       Radiology No results found.  Procedures Procedures (including critical care time)  Medications Ordered in ED Medications  predniSONE (DELTASONE) tablet 60 mg (not administered)  famotidine (PEPCID) tablet 20 mg (not administered)  loratadine (CLARITIN) tablet 10 mg (not administered)     Initial Impression / Assessment and Plan / ED Course  I have reviewed the triage vital signs and the nursing notes.  Pertinent labs & imaging results that were available during my care of the patient were reviewed by me and considered in my medical decision making (see chart for details).  Clinical Course    Discussed case with Dr. Johnney Killian, pt has plenty of EpiPens at home, I recommend he get allergy testing. Medications given in ED PO and also prescriptions for steroids, pepcid, claritin and advised to take Benadryl as needed.  I discussed results, diagnoses and plan with Geanie Cooley. They voice there understanding and questions were answered. We discussed follow-up recommendations and return precautions.   Final Clinical Impressions(s) / ED Diagnoses   Final diagnoses:  Allergic reaction, initial encounter    New Prescriptions New Prescriptions   FAMOTIDINE (PEPCID) 20 MG TABLET    Take 1 tablet (20 mg total) by  mouth 2 (two) times daily.   LORATADINE (CLARITIN) 10 MG TABLET    Take 1 tablet (10 mg total) by mouth daily.   PREDNISONE (DELTASONE) 10 MG TABLET    Take 2 tablets (20 mg total) by mouth daily.     Delos Haring, PA-C 10/06/15 [id number]    Charlesetta Shanks, MD 10/07/15 [phone]

## 2015-10-06 NOTE — ED Triage Notes (Addendum)
Pt states felt his tongue swelling, used epi pen at 1515-- was here a week ago for same and had noticed that he had loose stools prior to tongue swelling episode -- same thing happened today.  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 Does not remember having any of those today prior to swelling. Epi pen has reduced swelling presently-- no hives, no resp distress at present.

## 2015-10-07 DIAGNOSIS — T7849XA Other allergy, initial encounter: Secondary | ICD-10-CM | POA: Diagnosis not present

## 2016-10-15 DIAGNOSIS — I619 Nontraumatic intracerebral hemorrhage, unspecified: Secondary | ICD-10-CM

## 2016-10-15 HISTORY — DX: Nontraumatic intracerebral hemorrhage, unspecified: I61.9

## 2017-05-27 ENCOUNTER — Ambulatory Visit: Payer: Self-pay | Admitting: Orthopedic Surgery

## 2017-05-27 NOTE — Pre-Procedure Instructions (Signed)
Eddie Walter  05/27/2017      [facility] - [place], [place] - [address] AT [address] [place] [address] Phone: [phone] Fax: [phone]  [id number] - [place], [place] [address]  [place] [address] Phone: [phone] Fax: [phone]    Your procedure is scheduled on 06/03/2017.  Report to [facility] Admitting at 1:45 P.M.  Call this number if you have problems the morning of surgery:  [phone]   Remember:  Do not eat food or drink liquids after midnight.   Continue all medications as directed by your physician except follow these medication instructions before surgery below   Take these medicines the morning of surgery with A SIP OF WATER: Acetaminophen (Tylenol) - if needed Cetirizine (Zyrtec) Fluticasone (Flonase) - if needed Ranitidine (Zantac)   7 days prior to surgery STOP taking any Aspirin(unless otherwise instructed by your surgeon), Aleve, Naproxen, Ibuprofen, Motrin, Advil, Goody's, BC's, all herbal medications, fish oil, and all vitamins    Do not wear jewelry.  Do not wear lotions, powders, or colognes, or deodorant.  Men may shave face and neck.  Do not bring valuables to the hospital.  [facility] is not responsible for any belongings or valuables.  Hearing aids, eyeglasses, contacts, dentures or bridgework may not be worn into surgery.  Leave your suitcase in the car.  After surgery it may be brought to your room.  For patients admitted to the hospital, discharge time will be determined by your treatment team.  Patients discharged the day of surgery will not be allowed to drive home.   Name and phone number of your driver:    Special instructions:   [facility]- Preparing For Surgery  Before surgery, you can play an important role. Because skin is not sterile, your skin needs to be as free  of germs as possible. You can reduce the number of germs on your skin by washing with CHG (chlorahexidine gluconate) Soap before surgery.  CHG is an antiseptic cleaner which kills germs and bonds with the skin to continue killing germs even after washing.  Oral Hygiene is also important to reduce your risk of infection.  Remember - BRUSH YOUR TEETH THE MORNING OF SURGERY  Please do not use if you have an allergy to CHG or antibacterial soaps. If your skin becomes reddened/irritated stop using the CHG.  Do not shave (including legs and underarms) for at least 48 hours prior to first CHG shower. It is OK to shave your face.  Please follow these instructions carefully.   1. Shower the NIGHT BEFORE SURGERY and the MORNING OF SURGERY with CHG.   2. If you chose to wash your hair, wash your hair first as usual with your normal shampoo.  3. After you shampoo, rinse your hair and body thoroughly to remove the shampoo.  4. Use CHG as you would any other liquid soap. You can apply CHG directly to the skin and wash gently with a scrungie or a clean washcloth.   5. Apply the CHG Soap to your body ONLY FROM THE NECK DOWN.  Do not use on open wounds or open sores. Avoid contact with your eyes, ears, mouth and genitals (private parts). Wash Face and genitals (private parts)  with your normal soap.  6. Wash thoroughly, paying special attention to the area where your surgery will  be performed.  7. Thoroughly rinse your body with warm water from the neck down.  8. DO NOT shower/wash with your normal soap after using and rinsing off the CHG Soap.  9. Pat yourself dry with a CLEAN TOWEL.  10. Wear CLEAN PAJAMAS to bed the night before surgery, wear comfortable clothes the morning of surgery  11. Place CLEAN SHEETS on your bed the night of your first shower and DO NOT SLEEP WITH PETS.    Day of Surgery: Shower as stated above. Do not apply any deodorants/lotions. Please wear clean clothes to the  hospital/surgery center.   Remember to brush your teeth.      Please read over the following fact sheets that you were given.

## 2017-05-28 ENCOUNTER — Encounter (HOSPITAL_COMMUNITY)
Admission: RE | Admit: 2017-05-28 | Discharge: 2017-05-28 | Disposition: A | Discharge status: Home / Self Care | Payer: Medicare Other | Source: Ambulatory Visit | Attending: Orthopedic Surgery | Admitting: Orthopedic Surgery

## 2017-05-28 ENCOUNTER — Other Ambulatory Visit: Payer: Self-pay

## 2017-05-28 ENCOUNTER — Ambulatory Visit: Payer: Self-pay | Admitting: Orthopedic Surgery

## 2017-05-28 ENCOUNTER — Encounter (HOSPITAL_COMMUNITY): Payer: Self-pay

## 2017-05-28 DIAGNOSIS — Z01812 Encounter for preprocedural laboratory examination: Secondary | ICD-10-CM | POA: Diagnosis not present

## 2017-05-28 HISTORY — DX: Pure hypercholesterolemia, unspecified: E78.00

## 2017-05-28 HISTORY — DX: Nontraumatic intracerebral hemorrhage, unspecified: I61.9

## 2017-05-28 HISTORY — DX: Malignant (primary) neoplasm, unspecified: C80.1

## 2017-05-28 LAB — BASIC METABOLIC PANEL
ANION GAP: 9 (ref 5–15)
BUN: 17 mg/dL (ref 6–20)
CHLORIDE: 107 mmol/L (ref 101–111)
CO2: 24 mmol/L (ref 22–32)
Calcium: 9.6 mg/dL (ref 8.9–10.3)
Creatinine, Ser: 1.23 mg/dL (ref 0.61–1.24)
GFR calc Af Amer: >60 mL/min (ref >60)
GFR calc non Af Amer: 56 mL/min — ABNORMAL LOW (ref >60)
GLUCOSE: 132 mg/dL — AB (ref 65–99)
POTASSIUM: 5.8 mmol/L — AB (ref 3.5–5.1)
Sodium: 140 mmol/L (ref 135–145)

## 2017-05-28 LAB — CBC
HCT: 43.6 % (ref 39.0–52.0)
Hemoglobin: 14.6 g/dL (ref 13.0–17.0)
MCH: 30.3 pg (ref 26.0–34.0)
MCHC: 33.5 g/dL (ref 30.0–36.0)
MCV: 90.5 fL (ref 78.0–100.0)
Platelets: 263 K/uL (ref 150–400)
RBC: 4.82 MIL/uL (ref 4.22–5.81)
RDW: 14.1 % (ref 11.5–15.5)
WBC: 7.9 K/uL (ref 4.0–10.5)

## 2017-05-28 LAB — SURGICAL PCR SCREEN
MRSA, PCR: NEGATIVE
Staphylococcus aureus: POSITIVE — AB

## 2017-05-28 LAB — TYPE AND SCREEN
ABO/RH(D): O POS
Antibody Screen: NEGATIVE

## 2017-05-28 LAB — ABO/RH: ABO/RH(D): O POS
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
## 2017-05-28 NOTE — Progress Notes (Signed)
PCP - Dr. Clerance Lav at [facility] in [place] Cardiologist -   Chest x-ray - n/a EKG - patient states he believes he had one within the last 6 months; requesting records from the [place] - patient denies ECHO - patient states he believes he had one within the last 6 months; requesting records from the [place] [facility] - patient denies  Sleep Study - patient denies  Blood Thinner Instructions:n/a Aspirin Instructions: n/a  Anesthesia review: n/a  Patient denies shortness of breath, fever, cough and chest pain at PAT appointment   Patient verbalized understanding of instructions that were given to them at the PAT appointment. Patient was also instructed that they will need to review over the PAT instructions again at home before surgery.
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
## 2017-05-31 MED ORDER — ACETAMINOPHEN 10 MG/ML IV SOLN
1000.0000 mg | INTRAVENOUS | Status: AC
  Administered 2017-06-03: 1000 mg via INTRAVENOUS
  Filled 2017-05-31: qty 100

## 2017-05-31 MED ORDER — TRANEXAMIC ACID 1000 MG/10ML IV SOLN
1000.0000 mg | INTRAVENOUS | Status: AC
  Administered 2017-06-03: 1000 mg via INTRAVENOUS
  Filled 2017-05-31: qty 1100

## 2017-06-03 ENCOUNTER — Inpatient Hospital Stay (HOSPITAL_COMMUNITY): Payer: No Typology Code available for payment source | Admitting: Vascular Surgery

## 2017-06-03 ENCOUNTER — Inpatient Hospital Stay (HOSPITAL_COMMUNITY): Payer: No Typology Code available for payment source

## 2017-06-03 ENCOUNTER — Encounter (HOSPITAL_COMMUNITY)
Admission: RE | Disposition: A | Discharge status: Home Health | Payer: Self-pay | Source: Home / Self Care | Attending: Orthopedic Surgery

## 2017-06-03 ENCOUNTER — Encounter (HOSPITAL_COMMUNITY): Payer: Self-pay | Admitting: Surgery

## 2017-06-03 ENCOUNTER — Inpatient Hospital Stay (HOSPITAL_COMMUNITY)
Admission: RE | Admit: 2017-06-03 | Discharge: 2017-06-04 | DRG: 470 | Disposition: A | Discharge status: Home Health | Payer: No Typology Code available for payment source | Attending: Orthopedic Surgery | Admitting: Orthopedic Surgery

## 2017-06-03 ENCOUNTER — Inpatient Hospital Stay (HOSPITAL_COMMUNITY): Payer: No Typology Code available for payment source | Admitting: Emergency Medicine

## 2017-06-03 ENCOUNTER — Other Ambulatory Visit: Payer: Self-pay

## 2017-06-03 DIAGNOSIS — I10 Essential (primary) hypertension: Secondary | ICD-10-CM | POA: Diagnosis present

## 2017-06-03 DIAGNOSIS — Z85828 Personal history of other malignant neoplasm of skin: Secondary | ICD-10-CM

## 2017-06-03 DIAGNOSIS — Z79899 Other long term (current) drug therapy: Secondary | ICD-10-CM | POA: Diagnosis not present

## 2017-06-03 DIAGNOSIS — R42 Dizziness and giddiness: Secondary | ICD-10-CM | POA: Diagnosis not present

## 2017-06-03 DIAGNOSIS — Z8249 Family history of ischemic heart disease and other diseases of the circulatory system: Secondary | ICD-10-CM

## 2017-06-03 DIAGNOSIS — M1612 Unilateral primary osteoarthritis, left hip: Principal | ICD-10-CM | POA: Diagnosis present

## 2017-06-03 DIAGNOSIS — Z09 Encounter for follow-up examination after completed treatment for conditions other than malignant neoplasm: Secondary | ICD-10-CM

## 2017-06-03 DIAGNOSIS — Z91018 Allergy to other foods: Secondary | ICD-10-CM | POA: Diagnosis not present

## 2017-06-03 DIAGNOSIS — E785 Hyperlipidemia, unspecified: Secondary | ICD-10-CM | POA: Diagnosis present

## 2017-06-03 DIAGNOSIS — Z419 Encounter for procedure for purposes other than remedying health state, unspecified: Secondary | ICD-10-CM

## 2017-06-03 HISTORY — PX: TOTAL HIP ARTHROPLASTY: SHX124

## 2017-06-03 LAB — POCT I-STAT 4, (NA,K, GLUC, HGB,HCT)
GLUCOSE: 97 mg/dL (ref 65–99)
HCT: 36 % — ABNORMAL LOW (ref 39.0–52.0)
HEMOGLOBIN: 12.2 g/dL — AB (ref 13.0–17.0)
POTASSIUM: 3.7 mmol/L (ref 3.5–5.1)
Sodium: 141 mmol/L (ref 135–145)

## 2017-06-03 SURGERY — ARTHROPLASTY, HIP, TOTAL, ANTERIOR APPROACH
Anesthesia: Spinal | Site: Hip | Laterality: Left

## 2017-06-03 MED ORDER — ALUM & MAG HYDROXIDE-SIMETH 200-200-20 MG/5ML PO SUSP
30.0000 mL | ORAL | Status: DC | PRN
  Administered 2017-06-04: 30 mL via ORAL
  Filled 2017-06-03: qty 30

## 2017-06-03 MED ORDER — KETOROLAC TROMETHAMINE 30 MG/ML IJ SOLN
INTRAMUSCULAR | Status: AC
  Filled 2017-06-03: qty 1

## 2017-06-03 MED ORDER — KETOROLAC TROMETHAMINE 30 MG/ML IJ SOLN
INTRAMUSCULAR | Status: DC | PRN
  Administered 2017-06-03: 30 mg

## 2017-06-03 MED ORDER — BUPIVACAINE-EPINEPHRINE (PF) 0.5% -1:200000 IJ SOLN
INTRAMUSCULAR | Status: AC
  Filled 2017-06-03: qty 30

## 2017-06-03 MED ORDER — 0.9 % SODIUM CHLORIDE (POUR BTL) OPTIME
TOPICAL | Status: DC | PRN
  Administered 2017-06-03: 1000 mL

## 2017-06-03 MED ORDER — ONDANSETRON HCL 4 MG/2ML IJ SOLN
INTRAMUSCULAR | Status: DC | PRN
  Administered 2017-06-03: 4 mg via INTRAVENOUS

## 2017-06-03 MED ORDER — METOCLOPRAMIDE HCL 5 MG/ML IJ SOLN: 5.0000 mg | Freq: Three times a day (TID) | INTRAMUSCULAR | Status: DC | PRN

## 2017-06-03 MED ORDER — POVIDONE-IODINE 10 % EX SWAB: 2.0000 "application " | Freq: Once | CUTANEOUS | Status: DC

## 2017-06-03 MED ORDER — MIDAZOLAM HCL 2 MG/2ML IJ SOLN
INTRAMUSCULAR | Status: AC
  Filled 2017-06-03: qty 2

## 2017-06-03 MED ORDER — AMLODIPINE BESYLATE 5 MG PO TABS: 5.0000 mg | ORAL_TABLET | Freq: Every day | ORAL | Status: DC

## 2017-06-03 MED ORDER — FAMOTIDINE 20 MG PO TABS
20.0000 mg | ORAL_TABLET | Freq: Every day | ORAL | Status: DC
  Administered 2017-06-04: 20 mg via ORAL
  Filled 2017-06-03: qty 1

## 2017-06-03 MED ORDER — SODIUM CHLORIDE 0.9 % IV SOLN: INTRAVENOUS | Status: DC

## 2017-06-03 MED ORDER — FENTANYL CITRATE (PF) 250 MCG/5ML IJ SOLN
INTRAMUSCULAR | Status: AC
  Filled 2017-06-03: qty 5

## 2017-06-03 MED ORDER — BUPIVACAINE-EPINEPHRINE (PF) 0.5% -1:200000 IJ SOLN
INTRAMUSCULAR | Status: DC | PRN
  Administered 2017-06-03: 30 mL

## 2017-06-03 MED ORDER — LIDOCAINE HCL (CARDIAC) PF 100 MG/5ML IV SOSY
PREFILLED_SYRINGE | INTRAVENOUS | Status: DC | PRN
  Administered 2017-06-03: 100 mg via INTRAVENOUS

## 2017-06-03 MED ORDER — PHENOL 1.4 % MT LIQD: 1.0000 | OROMUCOSAL | Status: DC | PRN

## 2017-06-03 MED ORDER — ONDANSETRON HCL 4 MG PO TABS: 4.0000 mg | ORAL_TABLET | Freq: Four times a day (QID) | ORAL | Status: DC | PRN

## 2017-06-03 MED ORDER — ACETAMINOPHEN 325 MG PO TABS: 325.0000 mg | ORAL_TABLET | Freq: Four times a day (QID) | ORAL | Status: DC | PRN

## 2017-06-03 MED ORDER — CHLORHEXIDINE GLUCONATE 4 % EX LIQD: 60.0000 mL | Freq: Once | CUTANEOUS | Status: DC

## 2017-06-03 MED ORDER — FLUTICASONE PROPIONATE 50 MCG/ACT NA SUSP
2.0000 | Freq: Every day | NASAL | Status: DC | PRN
  Filled 2017-06-03: qty 16

## 2017-06-03 MED ORDER — PROPOFOL 10 MG/ML IV BOLUS
INTRAVENOUS | Status: AC
  Filled 2017-06-03: qty 20

## 2017-06-03 MED ORDER — PROPOFOL 10 MG/ML IV BOLUS
INTRAVENOUS | Status: DC | PRN
  Administered 2017-06-03: 100 mg via INTRAVENOUS
  Administered 2017-06-03: 30 mg via INTRAVENOUS

## 2017-06-03 MED ORDER — SENNA 8.6 MG PO TABS
1.0000 | ORAL_TABLET | Freq: Two times a day (BID) | ORAL | Status: DC
  Administered 2017-06-03 – 2017-06-04 (×2): 8.6 mg via ORAL
  Filled 2017-06-03 (×2): qty 1

## 2017-06-03 MED ORDER — GLYCOPYRROLATE 0.2 MG/ML IJ SOLN
INTRAMUSCULAR | Status: DC | PRN
  Administered 2017-06-03 (×2): 0.2 mg via INTRAVENOUS

## 2017-06-03 MED ORDER — ONDANSETRON HCL 4 MG/2ML IJ SOLN: 4.0000 mg | Freq: Four times a day (QID) | INTRAMUSCULAR | Status: DC | PRN

## 2017-06-03 MED ORDER — MORPHINE SULFATE (PF) 2 MG/ML IV SOLN: 0.5000 mg | INTRAVENOUS | Status: DC | PRN

## 2017-06-03 MED ORDER — METHOCARBAMOL 500 MG PO TABS
500.0000 mg | ORAL_TABLET | Freq: Four times a day (QID) | ORAL | Status: DC | PRN
  Administered 2017-06-03: 500 mg via ORAL
  Filled 2017-06-03: qty 1

## 2017-06-03 MED ORDER — POLYETHYLENE GLYCOL 3350 17 G PO PACK: 17.0000 g | PACK | Freq: Every day | ORAL | Status: DC | PRN

## 2017-06-03 MED ORDER — MENTHOL 3 MG MT LOZG: 1.0000 | LOZENGE | OROMUCOSAL | Status: DC | PRN

## 2017-06-03 MED ORDER — METOCLOPRAMIDE HCL 5 MG PO TABS: 5.0000 mg | ORAL_TABLET | Freq: Three times a day (TID) | ORAL | Status: DC | PRN

## 2017-06-03 MED ORDER — DIPHENHYDRAMINE HCL 12.5 MG/5ML PO ELIX: 12.5000 mg | ORAL_SOLUTION | ORAL | Status: DC | PRN

## 2017-06-03 MED ORDER — DEXAMETHASONE SODIUM PHOSPHATE 10 MG/ML IJ SOLN
10.0000 mg | Freq: Once | INTRAMUSCULAR | Status: AC
  Administered 2017-06-04: 10 mg via INTRAVENOUS
  Filled 2017-06-03: qty 1

## 2017-06-03 MED ORDER — MONTELUKAST SODIUM 10 MG PO TABS
10.0000 mg | ORAL_TABLET | Freq: Every evening | ORAL | Status: DC
  Administered 2017-06-03: 10 mg via ORAL
  Filled 2017-06-03: qty 1

## 2017-06-03 MED ORDER — METHOCARBAMOL 1000 MG/10ML IJ SOLN
500.0000 mg | Freq: Four times a day (QID) | INTRAMUSCULAR | Status: DC | PRN
  Filled 2017-06-03 (×2): qty 5

## 2017-06-03 MED ORDER — KETOROLAC TROMETHAMINE 15 MG/ML IJ SOLN: 7.5000 mg | Freq: Four times a day (QID) | INTRAMUSCULAR | Status: DC | PRN

## 2017-06-03 MED ORDER — ONDANSETRON HCL 4 MG/2ML IJ SOLN: 4.0000 mg | Freq: Once | INTRAMUSCULAR | Status: DC | PRN

## 2017-06-03 MED ORDER — PROPOFOL 500 MG/50ML IV EMUL
INTRAVENOUS | Status: DC | PRN
  Administered 2017-06-03: 75 ug/kg/min via INTRAVENOUS

## 2017-06-03 MED ORDER — LIDOCAINE 2% (20 MG/ML) 5 ML SYRINGE
INTRAMUSCULAR | Status: AC
  Filled 2017-06-03: qty 5

## 2017-06-03 MED ORDER — CEFAZOLIN SODIUM-DEXTROSE 2-4 GM/100ML-% IV SOLN
2.0000 g | INTRAVENOUS | Status: AC
  Administered 2017-06-03: 2 g via INTRAVENOUS

## 2017-06-03 MED ORDER — BUPIVACAINE IN DEXTROSE 0.75-8.25 % IT SOLN
INTRATHECAL | Status: DC | PRN
  Administered 2017-06-03: 2 mL via INTRATHECAL

## 2017-06-03 MED ORDER — ATORVASTATIN CALCIUM 40 MG PO TABS
40.0000 mg | ORAL_TABLET | Freq: Every day | ORAL | Status: DC
  Administered 2017-06-04: 40 mg via ORAL
  Filled 2017-06-03: qty 1

## 2017-06-03 MED ORDER — CEFAZOLIN SODIUM-DEXTROSE 2-4 GM/100ML-% IV SOLN
2.0000 g | Freq: Four times a day (QID) | INTRAVENOUS | Status: AC
  Administered 2017-06-04 (×2): 2 g via INTRAVENOUS
  Filled 2017-06-03 (×2): qty 100

## 2017-06-03 MED ORDER — PROPOFOL 1000 MG/100ML IV EMUL
INTRAVENOUS | Status: AC
  Filled 2017-06-03: qty 100

## 2017-06-03 MED ORDER — HYDROCODONE-ACETAMINOPHEN 7.5-325 MG PO TABS: 1.0000 | ORAL_TABLET | ORAL | Status: DC | PRN

## 2017-06-03 MED ORDER — DEXTROSE 5 % IV SOLN
INTRAVENOUS | Status: DC | PRN
  Administered 2017-06-03: 30 ug/min via INTRAVENOUS

## 2017-06-03 MED ORDER — ASPIRIN 81 MG PO CHEW
81.0000 mg | CHEWABLE_TABLET | Freq: Two times a day (BID) | ORAL | Status: DC
  Administered 2017-06-03 – 2017-06-04 (×2): 81 mg via ORAL
  Filled 2017-06-03 (×2): qty 1

## 2017-06-03 MED ORDER — LACTATED RINGERS IV SOLN
INTRAVENOUS | Status: DC
  Administered 2017-06-03: 15:00:00 via INTRAVENOUS

## 2017-06-03 MED ORDER — SODIUM CHLORIDE 0.9 % IV SOLN
INTRAVENOUS | Status: DC
  Administered 2017-06-03 – 2017-06-04 (×2): via INTRAVENOUS

## 2017-06-03 MED ORDER — FENTANYL CITRATE (PF) 250 MCG/5ML IJ SOLN
INTRAMUSCULAR | Status: DC | PRN
  Administered 2017-06-03 (×2): 25 ug via INTRAVENOUS

## 2017-06-03 MED ORDER — PHENYLEPHRINE HCL 10 MG/ML IJ SOLN
INTRAMUSCULAR | Status: DC | PRN
  Administered 2017-06-03 (×2): 80 ug via INTRAVENOUS

## 2017-06-03 MED ORDER — FENTANYL CITRATE (PF) 100 MCG/2ML IJ SOLN: 25.0000 ug | INTRAMUSCULAR | Status: DC | PRN

## 2017-06-03 MED ORDER — HYDROCODONE-ACETAMINOPHEN 5-325 MG PO TABS
1.0000 | ORAL_TABLET | ORAL | Status: DC | PRN
  Administered 2017-06-03: 2 via ORAL
  Filled 2017-06-03: qty 2

## 2017-06-03 MED ORDER — SODIUM CHLORIDE 0.9 % IJ SOLN
INTRAMUSCULAR | Status: DC | PRN
  Administered 2017-06-03: 30 mL

## 2017-06-03 MED ORDER — LORATADINE 10 MG PO TABS
10.0000 mg | ORAL_TABLET | Freq: Every day | ORAL | Status: DC
  Administered 2017-06-04: 10 mg via ORAL
  Filled 2017-06-03: qty 1

## 2017-06-03 MED ORDER — MIDAZOLAM HCL 2 MG/2ML IJ SOLN
INTRAMUSCULAR | Status: DC | PRN
  Administered 2017-06-03 (×2): 1 mg via INTRAVENOUS

## 2017-06-03 SURGICAL SUPPLY — 58 items
ADH SKN CLS APL DERMABOND .7 (GAUZE/BANDAGES/DRESSINGS) ×1
ALCOHOL ISOPROPYL (RUBBING) (MISCELLANEOUS) ×3 IMPLANT
BLADE CLIPPER SURG (BLADE) IMPLANT
CHLORAPREP W/TINT 26ML (MISCELLANEOUS) ×3 IMPLANT
COVER SURGICAL LIGHT HANDLE (MISCELLANEOUS) ×3 IMPLANT
DERMABOND ADVANCED (GAUZE/BANDAGES/DRESSINGS) ×2
DERMABOND ADVANCED .7 DNX12 (GAUZE/BANDAGES/DRESSINGS) ×2 IMPLANT
DRAPE C-ARM 42X72 X-RAY (DRAPES) ×3 IMPLANT
DRAPE STERI IOBAN 125X83 (DRAPES) ×3 IMPLANT
DRAPE U-SHAPE 47X51 STRL (DRAPES) ×9 IMPLANT
DRSG AQUACEL AG ADV 3.5X10 (GAUZE/BANDAGES/DRESSINGS) ×3 IMPLANT
ELECT BLADE 4.0 EZ CLEAN MEGAD (MISCELLANEOUS) ×3
ELECT PENCIL ROCKER SW 15FT (MISCELLANEOUS) ×3 IMPLANT
ELECT REM PT RETURN 9FT ADLT (ELECTROSURGICAL) ×3
ELECTRODE BLDE 4.0 EZ CLN MEGD (MISCELLANEOUS) ×1 IMPLANT
ELECTRODE REM PT RTRN 9FT ADLT (ELECTROSURGICAL) ×1 IMPLANT
EVACUATOR 1/8 PVC DRAIN (DRAIN) IMPLANT
GLOVE BIO SURGEON STRL SZ8.5 (GLOVE) ×6 IMPLANT
GLOVE BIOGEL PI IND STRL 8.5 (GLOVE) ×1 IMPLANT
GLOVE BIOGEL PI INDICATOR 8.5 (GLOVE) ×2
GOWN STRL REUS W/ TWL LRG LVL3 (GOWN DISPOSABLE) ×2 IMPLANT
GOWN STRL REUS W/TWL 2XL LVL3 (GOWN DISPOSABLE) ×3 IMPLANT
GOWN STRL REUS W/TWL LRG LVL3 (GOWN DISPOSABLE) ×6
HANDPIECE INTERPULSE COAX TIP (DISPOSABLE) ×3
HEAD CERAMIC DELTA 36MM -3 (Hips) ×2 IMPLANT
HOOD PEEL AWAY FACE SHEILD DIS (HOOD) ×6 IMPLANT
KIT BASIN OR (CUSTOM PROCEDURE TRAY) ×3 IMPLANT
KIT TURNOVER KIT B (KITS) ×3 IMPLANT
LINER ACET G7 36 SZH (Liner) ×3 IMPLANT
LINER ACTB H NTRL 36XHIP E1 G (Liner) IMPLANT
MANIFOLD NEPTUNE II (INSTRUMENTS) ×3 IMPLANT
MARKER SKIN DUAL TIP RULER LAB (MISCELLANEOUS) ×6 IMPLANT
NDL SPNL 18GX3.5 QUINCKE PK (NEEDLE) ×1 IMPLANT
NEEDLE SPNL 18GX3.5 QUINCKE PK (NEEDLE) ×3 IMPLANT
NS IRRIG 1000ML POUR BTL (IV SOLUTION) ×3 IMPLANT
PACK TOTAL JOINT (CUSTOM PROCEDURE TRAY) ×3 IMPLANT
PACK UNIVERSAL I (CUSTOM PROCEDURE TRAY) ×3 IMPLANT
PAD ARMBOARD 7.5X6 YLW CONV (MISCELLANEOUS) ×6 IMPLANT
SAW OSC TIP CART 19.5X105X1.3 (SAW) ×3 IMPLANT
SEALER BIPOLAR AQUA 6.0 (INSTRUMENTS) IMPLANT
SET HNDPC FAN SPRY TIP SCT (DISPOSABLE) ×1 IMPLANT
SHELL ACET G7 3H 64 SZH (Shell) ×2 IMPLANT
SOL PREP POV-IOD 4OZ 10% (MISCELLANEOUS) ×3 IMPLANT
STEM FEM CL 119X39.6 SZ17 133D (Stem) ×2 IMPLANT
SUT ETHIBOND NAB CT1 #1 30IN (SUTURE) ×6 IMPLANT
SUT MNCRL AB 3-0 PS2 18 (SUTURE) ×3 IMPLANT
SUT MON AB 2-0 CT1 36 (SUTURE) ×3 IMPLANT
SUT VIC AB 1 CT1 27 (SUTURE) ×3
SUT VIC AB 1 CT1 27XBRD ANBCTR (SUTURE) ×1 IMPLANT
SUT VIC AB 2-0 CT1 27 (SUTURE) ×3
SUT VIC AB 2-0 CT1 TAPERPNT 27 (SUTURE) ×1 IMPLANT
SUT VLOC 180 0 24IN GS25 (SUTURE) ×3 IMPLANT
SYR 50ML LL SCALE MARK (SYRINGE) ×3 IMPLANT
TOWEL OR 17X24 6PK STRL BLUE (TOWEL DISPOSABLE) ×3 IMPLANT
TOWEL OR 17X26 10 PK STRL BLUE (TOWEL DISPOSABLE) ×3 IMPLANT
TRAY CATH 16FR W/PLASTIC CATH (SET/KITS/TRAYS/PACK) IMPLANT
TRAY FOLEY CATH SILVER 16FR (SET/KITS/TRAYS/PACK) IMPLANT
WATER STERILE IRR 1000ML POUR (IV SOLUTION) ×9 IMPLANT

## 2017-06-03 NOTE — Anesthesia Procedure Notes (Signed)
Procedure Name: LMA Insertion Date/Time: 06/03/2017 5:27 PM Performed by: Jeremiah Curci T, CRNA Pre-anesthesia Checklist: Patient identified, Emergency Drugs available, Suction available and Patient being monitored Patient Re-evaluated:Patient Re-evaluated prior to induction Oxygen Delivery Method: Circle system utilized Preoxygenation: Pre-oxygenation with 100% oxygen Induction Type: IV induction LMA: LMA inserted LMA Size: 5.0 Placement Confirmation: positive ETCO2 Tube secured with: Tape Dental Injury: Teeth and Oropharynx as per pre-operative assessment

## 2017-06-03 NOTE — Anesthesia Procedure Notes (Signed)
Spinal  Patient location during procedure: OR Start time: 06/03/2017 4:31 PM End time: 06/03/2017 4:36 PM Staffing Anesthesiologist: Suzette Battiest, MD Performed: anesthesiologist  Preanesthetic Checklist Completed: patient identified, site marked, surgical consent, pre-op evaluation, timeout performed, IV checked, risks and benefits discussed and monitors and equipment checked Spinal Block Patient position: sitting Prep: site prepped and draped and DuraPrep Patient monitoring: blood pressure, continuous pulse ox and heart rate Approach: midline Location: L3-4 Injection technique: single-shot Needle Needle type: Pencan  Needle gauge: 24 G Needle length: 9 cm
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
## 2017-06-03 NOTE — Discharge Instructions (Signed)
°Dr. Lariyah Shetterly °Joint Replacement Specialist °[facility] °[address] °[place], [place] [address] °[phone] ° ° °TOTAL HIP REPLACEMENT POSTOPERATIVE DIRECTIONS ° ° ° °Hip Rehabilitation, Guidelines Following Surgery  ° °WEIGHT BEARING °Weight bearing as tolerated with assist device (walker, cane, etc) as directed, use it as long as suggested by your surgeon or therapist, typically at least 4-6 weeks. ° °The results of a hip operation are greatly improved after range of motion and muscle strengthening exercises. Follow all safety measures which are given to protect your hip. If any of these exercises cause increased pain or swelling in your joint, decrease the amount until you are comfortable again. Then slowly increase the exercises. Call your caregiver if you have problems or questions.  ° °HOME CARE INSTRUCTIONS  °Most of the following instructions are designed to prevent the dislocation of your new hip.  °Remove items at home which could result in a fall. This includes throw rugs or furniture in walking pathways.  °Continue medications as instructed at time of discharge. °· You may have some home medications which will be placed on hold until you complete the course of blood thinner medication. °· You may start showering once you are discharged home. Do not remove your dressing. °Do not put on socks or shoes without following the instructions of your caregivers.   °Sit on chairs with arms. Use the chair arms to help push yourself up when arising.  °Arrange for the use of a toilet seat elevator so you are not sitting low.  °· Walk with walker as instructed.  °You may resume a sexual relationship in one month or when given the OK by your caregiver.  °Use walker as long as suggested by your caregivers.  °You may put full weight on your legs and walk as much as is comfortable. °Avoid periods of inactivity such as sitting longer than an hour when not asleep. This helps prevent  blood clots.  °You may return to work once you are cleared by your surgeon.  °Do not drive a car for 6 weeks or until released by your surgeon.  °Do not drive while taking narcotics.  °Wear elastic stockings for two weeks following surgery during the day but you may remove then at night.  °Make sure you keep all of your appointments after your operation with all of your doctors and caregivers. You should call the office at the above phone number and make an appointment for approximately two weeks after the date of your surgery. °Please pick up a stool softener and laxative for home use as long as you are requiring pain medications. °· ICE to the affected hip every three hours for 30 minutes at a time and then as needed for pain and swelling. Continue to use ice on the hip for pain and swelling from surgery. You may notice swelling that will progress down to the foot and ankle.  This is normal after surgery.  Elevate the leg when you are not up walking on it.   °It is important for you to complete the blood thinner medication as prescribed by your doctor. °· Continue to use the breathing machine which will help keep your temperature down.  It is common for your temperature to cycle up and down following surgery, especially at night when you are not up moving around and exerting yourself.  The breathing machine keeps your lungs expanded and your temperature down. ° °RANGE OF MOTION AND STRENGTHENING EXERCISES  °These exercises are   designed to help you keep full movement of your hip joint. Follow your caregiver's or physical therapist's instructions. Perform all exercises about fifteen times, three times per day or as directed. Exercise both hips, even if you have had only one joint replacement. These exercises can be done on a training (exercise) mat, on the floor, on a table or on a bed. Use whatever works the best and is most comfortable for you. Use music or television while you are exercising so that the exercises  are a pleasant break in your day. This will make your life better with the exercises acting as a break in routine you can look forward to.  °Lying on your back, slowly slide your foot toward your buttocks, raising your knee up off the floor. Then slowly slide your foot back down until your leg is straight again.  °Lying on your back spread your legs as far apart as you can without causing discomfort.  °Lying on your side, raise your upper leg and foot straight up from the floor as far as is comfortable. Slowly lower the leg and repeat.  °Lying on your back, tighten up the muscle in the front of your thigh (quadriceps muscles). You can do this by keeping your leg straight and trying to raise your heel off the floor. This helps strengthen the largest muscle supporting your knee.  °Lying on your back, tighten up the muscles of your buttocks both with the legs straight and with the knee bent at a comfortable angle while keeping your heel on the floor.  ° °SKILLED REHAB INSTRUCTIONS: °If the patient is transferred to a skilled rehab facility following release from the hospital, a list of the current medications will be sent to the facility for the patient to continue.  When discharged from the skilled rehab facility, please have the facility set up the patient's Home Health Physical Therapy prior to being released. Also, the skilled facility will be responsible for providing the patient with their medications at time of release from the facility to include their pain medication and their blood thinner medication. If the patient is still at the rehab facility at time of the two week follow up appointment, the skilled rehab facility will also need to assist the patient in arranging follow up appointment in our office and any transportation needs. ° °MAKE SURE YOU:  °Understand these instructions.  °Will watch your condition.  °Will get help right away if you are not doing well or get worse. ° °Pick up stool softner and  laxative for home use following surgery while on pain medications. °Do not remove your dressing. °The dressing is waterproof--it is OK to take showers. °Continue to use ice for pain and swelling after surgery. °Do not use any lotions or creams on the incision until instructed by your surgeon. °Total Hip Protocol. ° ° °

## 2017-06-03 NOTE — Anesthesia Postprocedure Evaluation (Signed)
Anesthesia Post Note  Patient: Eddie Walter  Procedure(s) Performed: LEFT TOTAL HIP ARTHROPLASTY ANTERIOR APPROACH (Left Hip)     Patient location during evaluation: PACU Anesthesia Type: Spinal Level of consciousness: awake and alert Pain management: pain level controlled Vital Signs Assessment: post-procedure vital signs reviewed and stable Respiratory status: spontaneous breathing, nonlabored ventilation, respiratory function stable and patient connected to nasal cannula oxygen Cardiovascular status: blood pressure returned to baseline and stable Postop Assessment: no apparent nausea or vomiting Anesthetic complications: no    Last Vitals:  Vitals:   06/03/17 1943 06/03/17 1945  BP: (!) 112/58   Pulse: (!) 57 (!) 54  Resp: 13 10  Temp: (!) 36.4 C   SpO2: 96% 99%    Last Pain:  Vitals:   06/03/17 1943  TempSrc:   PainSc: 0-No pain                 Senya Hinzman COKER

## 2017-06-03 NOTE — Op Note (Signed)
OPERATIVE REPORT  SURGEON: Rod Can, MD   ASSISTANT: Ky Barban, RNFA.  PREOPERATIVE DIAGNOSIS: Left hip arthritis.   POSTOPERATIVE DIAGNOSIS: Left hip arthritis.   PROCEDURE: Left total hip arthroplasty, anterior approach.   IMPLANTS: Biomet Taperloc Complete Microplasty stem, size 17 x 119, hi offset. Biomet G7 Cup, size 64 mm. Biomet E1 liner, size 36 mm, H, neutral. Biomet Biolox ceramic head ball, size 36 - 3 mm.  ANESTHESIA:  Spinal  ESTIMATED BLOOD LOSS:-250 mL    ANTIBIOTICS: 2 g Ancef.  DRAINS: None.  COMPLICATIONS: None.   CONDITION: PACU - hemodynamically stable.   BRIEF CLINICAL NOTE: Eddie Walter is a 77 y.o. male with a long-standing history of Left hip arthritis. After failing conservative management, the patient was indicated for total hip arthroplasty. The risks, benefits, and alternatives to the procedure were explained, and the patient elected to proceed.  PROCEDURE IN DETAIL: Surgical site was marked by myself in the pre-op holding area. Once inside the operating room, spinal anesthesia was obtained, and a foley catheter was inserted. The patient was then positioned on the Hana table. All bony prominences were well padded. The hip was prepped and draped in the normal sterile surgical fashion. A time-out was called verifying side and site of surgery. The patient received IV antibiotics within 60 minutes of beginning the procedure.  The direct anterior approach to the hip was performed through the Hueter interval. Lateral femoral circumflex vessels were treated with the Auqumantys. The anterior capsule was exposed and an inverted T capsulotomy was made.The femoral neck cut was made to the level of the templated cut. A corkscrew was placed into the head and the head was removed. The femoral head was found to have eburnated bone. The head was passed to the back table and was measured.  Acetabular exposure was achieved, and the pulvinar  and labrum were excised. Sequential reaming of the acetabulum was then performed up to a size 63 mm reamer. A 65 mm cup was then opened and impacted into place at approximately 40 degrees of abduction and 20 degrees of anteversion. The final polyethylene liner was impacted into place and acetabular osteophytes were removed.   I then gained femoral exposure taking care to protect the abductors and greater trochanter. This was performed using standard external rotation, extension, and adduction. The capsule was peeled off the inner aspect of the greater trochanter, taking care to preserve the short external rotators. A cookie cutter was used to enter the femoral canal, and then the femoral canal finder was placed. Sequential broaching was performed up to a size 17. Calcar planer was used on the femoral neck remnant. I placed a hi offset neck and a trial head ball. The hip was reduced. Leg lengths and offset were checked fluoroscopically. The hip was dislocated and trial components were removed. The final implants were placed, and the hip was reduced.  Fluoroscopy was used to confirm component position and leg lengths. At 90 degrees of external rotation and full extension, the hip was stable to an anterior directed force.  The wound was copiously irrigated with normal saline using pulse lavage. Marcaine solution was injected into the periarticular soft tissue. The wound was closed in layers using #1 Vicryl and V-Loc for the fascia, 2-0 Vicryl for the subcutaneous fat, 2-0 Monocryl for the deep dermal layer, 3-0 running Monocryl subcuticular stitch, and Dermabond for the skin. Once the glue was fully dried, an Aquacell Ag dressing was applied. The patient was transported to the  recovery room in stable condition. Sponge, needle, and instrument counts were correct at the end of the case x2. The patient tolerated the procedure well and there were no known complications.

## 2017-06-03 NOTE — Interval H&P Note (Signed)
History and Physical Interval Note:  06/03/2017 3:10 PM  Eddie Walter  has presented today for surgery, with the diagnosis of Degenerative joint disease left hip  The various methods of treatment have been discussed with the patient and family. After consideration of risks, benefits and other options for treatment, the patient has consented to  Procedure(s) with comments: LEFT TOTAL HIP ARTHROPLASTY ANTERIOR APPROACH (Left) - Needs RNFA as a surgical intervention .  The patient's history has been reviewed, patient examined, no change in status, stable for surgery.  I have reviewed the patient's chart and labs.  Questions were answered to the patient's satisfaction.     Hilton Cork Keygan Dumond

## 2017-06-03 NOTE — Transfer of Care (Signed)
Immediate Anesthesia Transfer of Care Note  Patient: Eddie Walter  Procedure(s) Performed: LEFT TOTAL HIP ARTHROPLASTY ANTERIOR APPROACH (Left Hip)  Patient Location: PACU  Anesthesia Type:General  Level of Consciousness: responds to stimulation  Airway & Oxygen Therapy: Patient Spontanous Breathing  Post-op Assessment: Report given to RN and Post -op Vital signs reviewed and stable  Post vital signs: Reviewed and stable  Last Vitals:  Vitals Value Taken Time  BP 113/62 06/03/2017  7:10 PM  Temp 36.4 C 06/03/2017  7:08 PM  Pulse 60 06/03/2017  7:13 PM  Resp 15 06/03/2017  7:13 PM  SpO2 92 % 06/03/2017  7:13 PM  Vitals shown include unvalidated device data.  Last Pain:  Vitals:   06/03/17 1416  TempSrc:   PainSc: 0-No pain      Patients Stated Pain Goal: 3 (61/51/83 4373)  Complications: No apparent anesthesia complications

## 2017-06-04 ENCOUNTER — Encounter (HOSPITAL_COMMUNITY): Payer: Self-pay | Admitting: Orthopedic Surgery

## 2017-06-04 LAB — BASIC METABOLIC PANEL
Anion gap: 9 (ref 5–15)
BUN: 21 mg/dL — ABNORMAL HIGH (ref 6–20)
CALCIUM: 8.7 mg/dL — AB (ref 8.9–10.3)
CO2: 23 mmol/L (ref 22–32)
Chloride: 108 mmol/L (ref 101–111)
Creatinine, Ser: 1.29 mg/dL — ABNORMAL HIGH (ref 0.61–1.24)
GFR, EST NON AFRICAN AMERICAN: 53 mL/min — AB (ref >60)
GLUCOSE: 195 mg/dL — AB (ref 65–99)
POTASSIUM: 4.7 mmol/L (ref 3.5–5.1)
Sodium: 140 mmol/L (ref 135–145)

## 2017-06-04 LAB — CBC
HEMATOCRIT: 34.8 % — AB (ref 39.0–52.0)
Hemoglobin: 11.8 g/dL — ABNORMAL LOW (ref 13.0–17.0)
MCH: 30 pg (ref 26.0–34.0)
MCHC: 33.9 g/dL (ref 30.0–36.0)
MCV: 88.5 fL (ref 78.0–100.0)
PLATELETS: 255 10*3/uL (ref 150–400)
RBC: 3.93 MIL/uL — AB (ref 4.22–5.81)
RDW: 13 % (ref 11.5–15.5)
WBC: 13.9 10*3/uL — ABNORMAL HIGH (ref 4.0–10.5)

## 2017-06-04 MED ORDER — HYDROCODONE-ACETAMINOPHEN 5-325 MG PO TABS: 1.0000 | ORAL_TABLET | Freq: Four times a day (QID) | ORAL | 0 refills | Status: DC | PRN

## 2017-06-04 MED ORDER — ONDANSETRON HCL 4 MG PO TABS: 4.0000 mg | ORAL_TABLET | Freq: Four times a day (QID) | ORAL | 0 refills | Status: DC | PRN

## 2017-06-04 MED ORDER — ASPIRIN 81 MG PO CHEW: 81.0000 mg | CHEWABLE_TABLET | Freq: Two times a day (BID) | ORAL | 1 refills | Status: AC

## 2017-06-04 MED ORDER — SENNA 8.6 MG PO TABS: 1.0000 | ORAL_TABLET | Freq: Two times a day (BID) | ORAL | 0 refills | Status: DC

## 2017-06-04 NOTE — Care Management Note (Signed)
Case Management Note  Patient Details  Name: Eddie Walter MRN: [id number] Date of Birth: Jun 28, 1940  Subjective/Objective:  77 yr old gentleman s/p left total hip arthroplasty.                Action/Plan: Case manager spoke with patient concerning discharge plan and DME. Patient is active with [employer]. Case manager faxed order for Home Health PT to his Primary Dr[facility] At [facility] - Dr. Hazle Quant. Faxed to [phone]. Case manager called Ennis Forts, [facility] with referral. Patient will have family support at discharge.   Expected Discharge Date:  06/04/17               Expected Discharge Plan:  [place]  In-House Referral:     Discharge planning Services  CM Consult  Post Acute Care Choice:  Home Health, Durable Medical Equipment Choice offered to:  Patient  DME Arranged:  3-N-1 DME Agency:     HH Arranged:    [place] Agency:     Status of Service:     If discussed at [employer] of Stay Meetings, dates discussed:    Additional Comments:  Ninfa Meeker, RN 06/04/2017, 5:06 PM

## 2017-06-04 NOTE — Evaluation (Signed)
Physical Therapy Evaluation Patient Details Name: Eddie Walter MRN: [id number] DOB: July 09, 1940 Today's Date: 06/04/2017   History of Present Illness  Eddie Walter is a 77yo white male who comes to [facility] for elective anterior Lt THA. Pt reports syncopal episode the night of surgery when toiletting with nursing.   Clinical Impression  Pt admitted with above diagnosis. Pt currently with functional limitations due to the deficits listed below (see "PT Problem List"). Pt performing HEP with min-mod physical assist. Bed mobility, transfers, AMB, and stairs are performed with MigGuardA for safety and heavy cues for safety particularly during transfers, and turning in hall. No LOB or gait instability observed this date. Pt denies any dizziness, nausea, or other presyncopal prodrome. Orthostatic BP established with noted systolic drop >24PYKD after all AMB activity, pt otherwise feeling fine. No significant pain limitations, but pt endorses stiffness that is limiting. Pt will benefit from skilled PT intervention to increase independence and safety with basic mobility in preparation for discharge to the venue listed below.       Follow Up Recommendations Follow surgeon's recommendation for DC plan and follow-up therapies;Supervision for mobility/OOB;Home health PT    Equipment Recommendations  None recommended by PT    Recommendations for Other Services       Precautions / Restrictions Precautions Precautions: None;Anterior Hip Restrictions Weight Bearing Restrictions: No LLE Weight Bearing: Weight bearing as tolerated      Mobility  Bed Mobility Overal bed mobility: Modified Independent                Transfers Overall transfer level: Needs assistance Equipment used: Rolling walker (2 wheeled) Transfers: Sit to/from Stand Sit to Stand: Supervision         General transfer comment: Frequent VC for safety, although effort is low, RW often too far from chair prior to  standing/sitting  Ambulation/Gait Ambulation/Gait assistance: Min guard Ambulation Distance (Feet): 160 Feet Assistive device: Rolling walker (2 wheeled) Gait Pattern/deviations: WFL(Within Functional Limits);Step-through pattern;Antalgic Gait velocity: 0.73m/s       Stairs Stairs: Yes Stairs assistance: Min guard Stair Management: No rails;Backwards;With walker Number of Stairs: 2    Wheelchair Mobility    Modified Rankin (Stroke Patients Only)       Balance Overall balance assessment: Modified Independent(syncopal episode 1DA, mild orthostasis today)                                           Pertinent Vitals/Pain Pain Assessment: No/denies pain(only stiffness at front of joint)    Home Living Family/patient expects to be discharged to:: Private residence Living Arrangements: Spouse/significant other(wife, 67yo son) Available Help at Discharge: Family Type of Home: House Home Access: Stairs to enter Entrance Stairs-Rails: None Entrance Stairs-Number of Steps: 1 full step at front door  Home Layout: One level Home Equipment: Environmental consultant - 2 wheels      Prior Function Level of Independence: Independent               Hand Dominance   Dominant Hand: Right    Extremity/Trunk Assessment        Lower Extremity Assessment Lower Extremity Assessment: LLE deficits/detail LLE Deficits / Details: general tightness in the joint particularly in the frontal plane, pt reports as chronic, suspect chronic adaptive/pathological shortening/spasm of periarticular muscles common in progressive OA     Cervical / Trunk Assessment Cervical / Trunk Assessment: Normal  Communication   Communication: No difficulties  Cognition Arousal/Alertness: Awake/alert Behavior During Therapy: WFL for tasks assessed/performed Overall Cognitive Status: Within Functional Limits for tasks assessed                                        General  Comments      Exercises Total Joint Exercises Short Arc Quad: AROM;Left;10 reps;Supine Heel Slides: AAROM;Left;10 reps;Supine Hip ABduction/ADduction: Supine;AAROM;Left;10 reps(limited by adductor spasm)   Assessment/Plan    PT Assessment Patient needs continued PT services  PT Problem List Decreased strength;Decreased range of motion;Decreased activity tolerance;Decreased mobility       PT Treatment Interventions DME instruction;Gait training;Stair training;Functional mobility training;Therapeutic activities;Therapeutic exercise;Patient/family education    PT Goals (Current goals can be found in the Care Plan section)  Acute Rehab PT Goals Patient Stated Goal: return to independent AMB in community  PT Goal Formulation: With patient Time For Goal Achievement: 06/18/17 Potential to Achieve Goals: Good    Frequency 7X/week   Barriers to discharge        Co-evaluation               AM-PAC PT "6 Clicks" Daily Activity  Outcome Measure Difficulty turning over in bed (including adjusting bedclothes, sheets and blankets)?: A Little Difficulty moving from lying on back to sitting on the side of the bed? : A Little Difficulty sitting down on and standing up from a chair with arms (e.g., wheelchair, bedside commode, etc,.)?: A Little Help needed moving to and from a bed to chair (including a wheelchair)?: A Little Help needed walking in hospital room?: A Little Help needed climbing 3-5 steps with a railing? : A Little 6 Click Score: 18    End of Session Equipment Utilized During Treatment: Gait belt Activity Tolerance: Patient tolerated treatment well;Treatment limited secondary to medical complications (Comment)(mild orthostatic BP after AM/stairs, pt assymptomatic) Patient left: in chair;with call bell/phone within reach Nurse Communication: Mobility status(vitals) PT Visit Diagnosis: Unsteadiness on feet (R26.81);Difficulty in walking, not elsewhere classified  (R26.2);Muscle weakness (generalized) (M62.81);Dizziness and giddiness (R42)    Time: 5790-3833 PT Time Calculation (min) (ACUTE ONLY): 38 min   Charges:   PT Evaluation $PT Eval Low Complexity: 1 Low PT Treatments $Gait Training: 8-22 mins $Therapeutic Exercise: 8-22 mins   PT G Codes:        11:39 AM, 25-Jun-2017 Etta Grandchild, PT, DPT Physical Therapist - [place] [phone] (Pager)  [phone] (Office)     Jourden Gilson C 2017-06-25, 11:36 AM
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
## 2017-06-04 NOTE — Progress Notes (Signed)
Physical Therapy Treatment Patient Details Name: Eddie Walter MRN: [id number] DOB: [birth year] Today's Date: 06/04/2017    History of Present Illness Eddie Walter is a 77yo white male who comes to [facility] for elective anterior Lt THA. Pt reports syncopal episode the night of surgery when toiletting with nursing.     PT Comments    Pt progressing well for 2nd PT session this date. Progressed AMB to 440ft, still without presyncopal prodrome, terminal BP: 156/69. Pt has been mobilizing to BR with nursing since PT session this morning. Pt continues to report no pain. Pt is issued a copy of HEP and discussed in full. Pt is safe for DC to home from PT standpoint.     Follow Up Recommendations  Follow surgeon's recommendation for DC plan and follow-up therapies;Supervision for mobility/OOB;Home health PT     Equipment Recommendations  None recommended by PT    Recommendations for Other Services       Precautions / Restrictions Precautions Precautions: None;Anterior Hip Restrictions Weight Bearing Restrictions: No LLE Weight Bearing: Weight bearing as tolerated    Mobility  Bed Mobility Overal bed mobility: Modified Independent             General bed mobility comments: received up in chair   Transfers Overall transfer level: Needs assistance Equipment used: Rolling walker (2 wheeled) Transfers: Sit to/from Stand Sit to Stand: Supervision         General transfer comment:  VC for safe RW practices, Pt continues to stand without RW.   Ambulation/Gait Ambulation/Gait assistance: Supervision Ambulation Distance (Feet): 400 Feet Assistive device: Rolling walker (2 wheeled) Gait Pattern/deviations: WFL(Within Functional Limits);Step-through pattern;Antalgic Gait velocity: 0.73m/s (0.33m/s this morning)        Stairs Stairs: Yes  Stair Management: No rails;Backwards;With walker Number of Stairs: 2     Wheelchair Mobility    Modified Rankin (Stroke  Patients Only)       Balance Overall balance assessment: Modified Independent                                          Cognition Arousal/Alertness: Awake/alert Behavior During Therapy: WFL for tasks assessed/performed Overall Cognitive Status: Within Functional Limits for tasks assessed                                        Exercises     General Comments        Pertinent Vitals/Pain Pain Assessment: No/denies pain    Home Living Family/patient expects to be discharged to:: Private residence Living Arrangements: Spouse/significant other(wife, 49yo son) Available Help at Discharge: Family Type of Home: House Home Access: Stairs to enter Entrance Stairs-Rails: None Home Layout: One level Home Equipment: Environmental consultant - 2 wheels      Prior Function Level of Independence: Independent          PT Goals (current goals can now be found in the care plan section) Acute Rehab PT Goals Patient Stated Goal: return to independent AMB in community  PT Goal Formulation: With patient Time For Goal Achievement: 06/18/17 Potential to Achieve Goals: Good Progress towards PT goals: Progressing toward goals    Frequency    7X/week      PT Plan Current plan remains appropriate    Co-evaluation  AM-PAC PT "6 Clicks" Daily Activity  Outcome Measure  Difficulty turning over in bed (including adjusting bedclothes, sheets and blankets)?: A Little Difficulty moving from lying on back to sitting on the side of the bed? : A Little Difficulty sitting down on and standing up from a chair with arms (e.g., wheelchair, bedside commode, etc,.)?: A Little Help needed moving to and from a bed to chair (including a wheelchair)?: None Help needed walking in hospital room?: A Little Help needed climbing 3-5 steps with a railing? : A Little 6 Click Score: 19    End of Session Equipment Utilized During Treatment: Gait belt Activity Tolerance:  Patient tolerated treatment well;No increased pain Patient left: in chair;with call bell/phone within reach Nurse Communication: Mobility status(vitals) PT Visit Diagnosis: Unsteadiness on feet (R26.81);Difficulty in walking, not elsewhere classified (R26.2);Muscle weakness (generalized) (M62.81);Dizziness and giddiness (R42)     Time: 9021-1155 PT Time Calculation (min) (ACUTE ONLY): 18 min  Charges:  $Gait Training: 8-22 mins $Therapeutic Exercise: 8-22 mins                    G Codes:       2:11 PM, 2017/06/30 Etta Grandchild, PT, DPT Physical Therapist - [place] [phone] (Pager)  [phone] (Office)      Patirica Longshore C 06/30/2017, 2:09 PM

## 2017-06-04 NOTE — Progress Notes (Addendum)
    Subjective:  Patient reports pain as mild to moderate.  Denies N/V/CP/SOB. Pt states he became dizzy last night.  Objective:   VITALS:   Vitals:   06/03/17 1945 06/03/17 2010 06/03/17 2243 06/04/17 0514  BP:  120/74 112/75 131/68  Pulse: (!) 54 (!) 53 (!) 55 72  Resp: 10 14  17   Temp:  (!) 97.5 F (36.4 C)    TempSrc:  Oral    SpO2: 99% 96% 100% 98%  Weight:      Height:        NAD ABD soft Sensation intact distally Intact pulses distally Dorsiflexion/Plantar flexion intact Incision: dressing C/D/I Compartment soft   Lab Results  Component Value Date   WBC 13.9 (H) 06/04/2017   HGB 11.8 (L) 06/04/2017   HCT 34.8 (L) 06/04/2017   MCV 88.5 06/04/2017   PLT 255 06/04/2017   BMET    Component Value Date/Time   NA 141 06/03/2017 1441   K 3.7 06/03/2017 1441   CL 107 05/28/2017 0856   CO2 24 05/28/2017 0856   GLUCOSE 97 06/03/2017 1441   BUN 17 05/28/2017 0856   CREATININE 1.23 05/28/2017 0856   CALCIUM 9.6 05/28/2017 0856   GFRNONAA 56 (L) 05/28/2017 0856   GFRAA >60 05/28/2017 0856   Anticipated LOS equal to or greater than 2 midnights due to - Age 75 and older with one or more of the following:  - Obesity  - Expected need for hospital services (PT, OT, Nursing) required for safe  discharge  - Anticipated need for postoperative skilled nursing care or inpatient rehab  - Active co-morbidities: None OR   - Unanticipated findings during/Post Surgery: Slow post-op progression: GI, pain control, mobility  - Patient is a high risk of re-admission due to: None    Assessment/Plan: 1 Day Post-Op   Principal Problem:   Primary osteoarthritis of left hip   WBAT with walker DVT ppx: Aspirin, SCDs, TEDS PO pain control PT/OT Dizzyness: ? Meds, ? Volume loss, hgb 11.8, encourage fluids, monitor Dispo: D/C home with HHPT after clears therapy, today vs tomorrow   Eddie Walter 06/04/2017, 7:27 AM   Rod Can, MD Cell [phone]

## 2017-06-09 ENCOUNTER — Emergency Department (HOSPITAL_COMMUNITY)
Admission: EM | Admit: 2017-06-09 | Discharge: 2017-06-09 | Disposition: A | Discharge status: Home / Self Care | Payer: Medicare Other | Attending: Emergency Medicine | Admitting: Emergency Medicine

## 2017-06-09 ENCOUNTER — Encounter (HOSPITAL_COMMUNITY): Payer: Self-pay | Admitting: Emergency Medicine

## 2017-06-09 ENCOUNTER — Other Ambulatory Visit: Payer: Self-pay

## 2017-06-09 DIAGNOSIS — Z7982 Long term (current) use of aspirin: Secondary | ICD-10-CM | POA: Insufficient documentation

## 2017-06-09 DIAGNOSIS — Z79899 Other long term (current) drug therapy: Secondary | ICD-10-CM | POA: Diagnosis not present

## 2017-06-09 DIAGNOSIS — R339 Retention of urine, unspecified: Secondary | ICD-10-CM | POA: Diagnosis present

## 2017-06-09 DIAGNOSIS — R338 Other retention of urine: Secondary | ICD-10-CM

## 2017-06-09 LAB — URINALYSIS, ROUTINE W REFLEX MICROSCOPIC
BACTERIA UA: NONE SEEN
Bilirubin Urine: NEGATIVE
Glucose, UA: NEGATIVE mg/dL
Ketones, ur: NEGATIVE mg/dL
Leukocytes, UA: NEGATIVE
Nitrite: NEGATIVE
Protein, ur: NEGATIVE mg/dL
SPECIFIC GRAVITY, URINE: 1.01 (ref 1.005–1.030)
pH: 5 (ref 5.0–8.0)

## 2017-06-09 MED ORDER — TAMSULOSIN HCL 0.4 MG PO CAPS: 0.4000 mg | ORAL_CAPSULE | Freq: Every day | ORAL | 0 refills | Status: DC

## 2017-06-09 NOTE — ED Notes (Signed)
Patient Alert and oriented to baseline. Stable and ambulatory to baseline. Patient verbalized understanding of the discharge instructions.  Patient belongings were taken by the patient.   

## 2017-06-09 NOTE — Care Management (Signed)
06/09/17 1740 Received call from pharmacy that patient is allergic to gelatin and cannot take capsules.  Spoke with ED Pod E MD who states that if the patient has a Foley and will f/u with urology, they can change to a different medication as they feel appropriate.

## 2017-06-09 NOTE — ED Triage Notes (Signed)
Patient is from home not being able to urinate.  Bladder scanner shows >999 Ml.  In obvious pain.
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
## 2017-06-10 LAB — URINE CULTURE: CULTURE: NO GROWTH

## 2019-12-23 ENCOUNTER — Encounter (HOSPITAL_COMMUNITY): Payer: Self-pay | Admitting: *Deleted

## 2019-12-23 ENCOUNTER — Other Ambulatory Visit: Payer: Self-pay

## 2019-12-23 ENCOUNTER — Emergency Department (HOSPITAL_COMMUNITY)
Admission: EM | Admit: 2019-12-23 | Discharge: 2019-12-23 | Disposition: A | Discharge status: Home / Self Care | Payer: Medicare Other | Attending: Emergency Medicine | Admitting: Emergency Medicine

## 2019-12-23 DIAGNOSIS — Z85828 Personal history of other malignant neoplasm of skin: Secondary | ICD-10-CM | POA: Diagnosis not present

## 2019-12-23 DIAGNOSIS — T7840XA Allergy, unspecified, initial encounter: Secondary | ICD-10-CM

## 2019-12-23 DIAGNOSIS — Z96642 Presence of left artificial hip joint: Secondary | ICD-10-CM | POA: Insufficient documentation

## 2019-12-23 DIAGNOSIS — Z7982 Long term (current) use of aspirin: Secondary | ICD-10-CM | POA: Insufficient documentation

## 2019-12-23 MED ORDER — EPINEPHRINE 0.3 MG/0.3ML IJ SOAJ: 0.3000 mg | INTRAMUSCULAR | 0 refills | Status: AC | PRN
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
## 2019-12-23 NOTE — Discharge Instructions (Signed)
If you need to use your EpiPen again, call 911 afterwards.  Follow-up with your primary care provider. Return to the ED for worsening symptoms, increase in lip or tongue swelling, trouble breathing or trouble swallowing.

## 2019-12-23 NOTE — ED Triage Notes (Signed)
The pt has a allergy to hoof meat he does not remember that he had any meat but around 1700 his tongue started to swell  He gave himself an epi injection  He thinks that his tongue has started to go down somewhat no difficulty breathing

## 2019-12-29 ENCOUNTER — Encounter (HOSPITAL_COMMUNITY): Payer: Self-pay | Admitting: Emergency Medicine

## 2019-12-29 ENCOUNTER — Emergency Department (HOSPITAL_COMMUNITY)
Admission: EM | Admit: 2019-12-29 | Discharge: 2019-12-30 | Disposition: A | Discharge status: Home / Self Care | Payer: No Typology Code available for payment source | Attending: Emergency Medicine | Admitting: Emergency Medicine

## 2019-12-29 ENCOUNTER — Other Ambulatory Visit: Payer: Self-pay

## 2019-12-29 DIAGNOSIS — Z85828 Personal history of other malignant neoplasm of skin: Secondary | ICD-10-CM | POA: Insufficient documentation

## 2019-12-29 DIAGNOSIS — Z96642 Presence of left artificial hip joint: Secondary | ICD-10-CM | POA: Diagnosis not present

## 2019-12-29 DIAGNOSIS — T7840XA Allergy, unspecified, initial encounter: Secondary | ICD-10-CM | POA: Diagnosis present

## 2019-12-29 DIAGNOSIS — Z79899 Other long term (current) drug therapy: Secondary | ICD-10-CM | POA: Insufficient documentation

## 2019-12-29 DIAGNOSIS — Z7982 Long term (current) use of aspirin: Secondary | ICD-10-CM | POA: Insufficient documentation

## 2019-12-29 NOTE — ED Triage Notes (Addendum)
Pt sts at 7:15 tonight he noticed a bump on right side of tongue that started to swell,.  Sts he used his epi pen.  Pt sts swelling has gone down now.  Pt speaking in full sentences, no resp distress noted  Pt sts he was here last week for same thing
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
## 2019-12-30 NOTE — Discharge Instructions (Signed)
If you experience any tongue/facial swelling, difficulty swallowing, throat swelling or difficulty breathing then you should use your EpiPen and return to the emergency department for reassessment.  Please follow-up with an allergist and return the emergency department for any new or worsening symptoms meantime.

## 2020-01-08 IMAGING — RF DG HIP (WITH PELVIS) OPERATIVE*L*
1 series · 2 of 2 positions shown · non-contrast
Comparison: 01/28/2004 pelvic and bilateral hip radiograph

CLINICAL DATA: Left total hip arthroplasty

EXAM:
PORTABLE PELVIS 1-2 VIEWS; DG C-ARM 61-120 MIN; OPERATIVE LEFT HIP
WITH PELVIS

[Series 1: run · 2 of 2 slices shown]
[im 1/2]
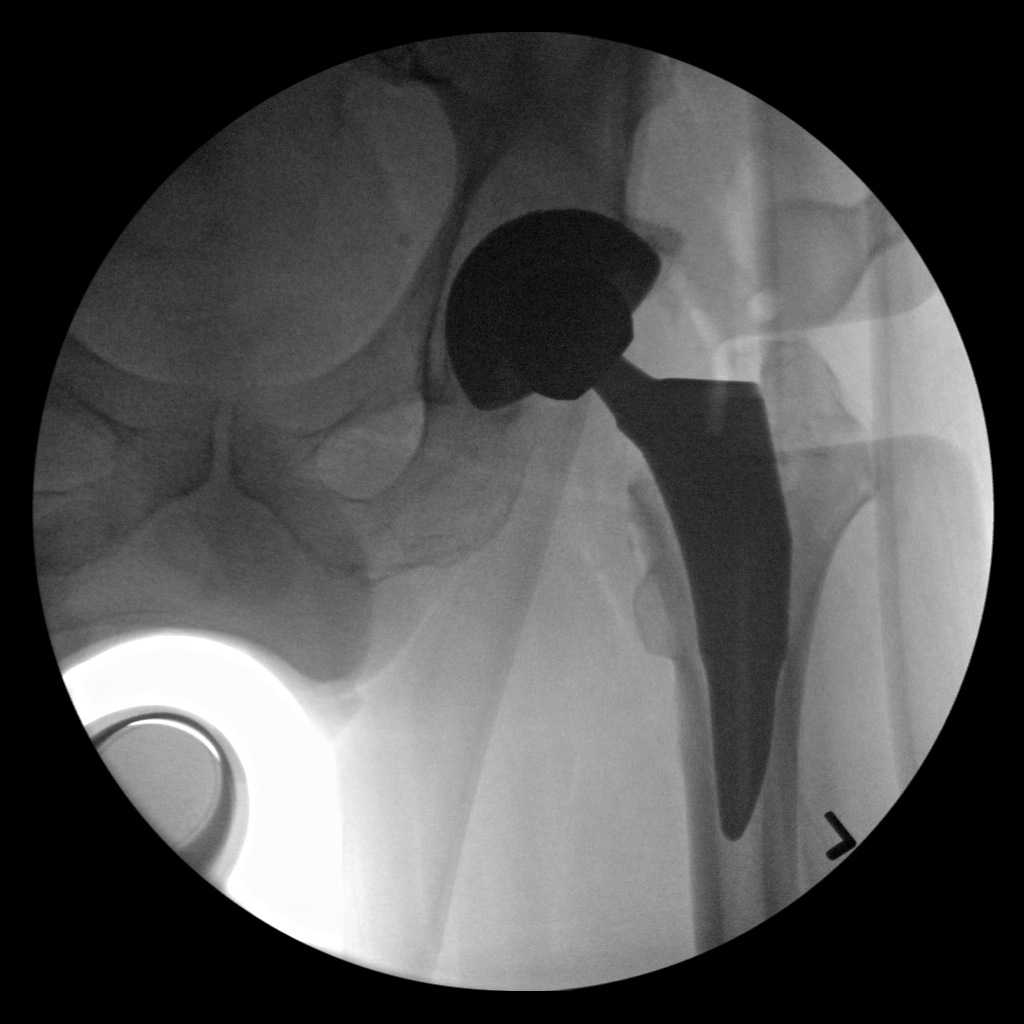
[im 2/2]
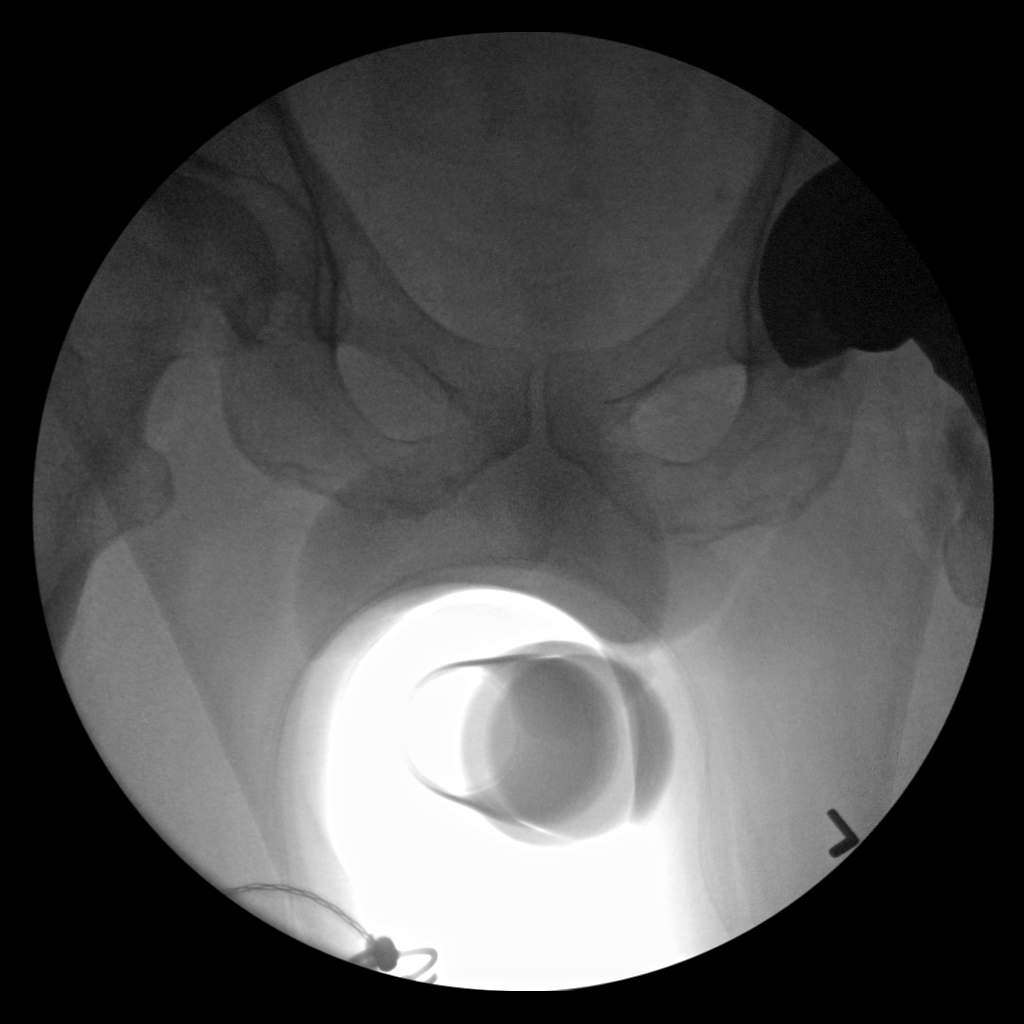

[2 of 2 positions shown; findings below may reference images not displayed]

FINDINGS: Fluoroscopy time insert minutes 20 seconds. Two spot fluoroscopic
nondiagnostic intraoperative radiographs of the left hip demonstrate
postsurgical changes from left total hip arthroplasty.

Single frontal portable postoperative radiograph demonstrates
postsurgical changes from left total hip arthroplasty, with
well-positioned left acetabular and left proximal femoral
prostheses, with no evidence of hardware fracture or loosening. No
evidence of hip dislocation. No acute osseous fracture. No
suspicious focal osseous lesions. Expected gas surrounding the left
hip joint.
IMPRESSION: Intraoperative fluoroscopic guidance for left total hip
arthroplasty.

Single frontal portable postoperative pelvic radiograph demonstrates
satisfactory immediate postoperative appearance from left total hip
arthroplasty.

## 2020-01-08 IMAGING — DX DG PORTABLE PELVIS
1 series · 1 of 1 positions shown · non-contrast
Comparison: 01/28/2004 pelvic and bilateral hip radiograph

CLINICAL DATA: Left total hip arthroplasty

EXAM:
PORTABLE PELVIS 1-2 VIEWS; DG C-ARM 61-120 MIN; OPERATIVE LEFT HIP
WITH PELVIS

[pelvis ap]
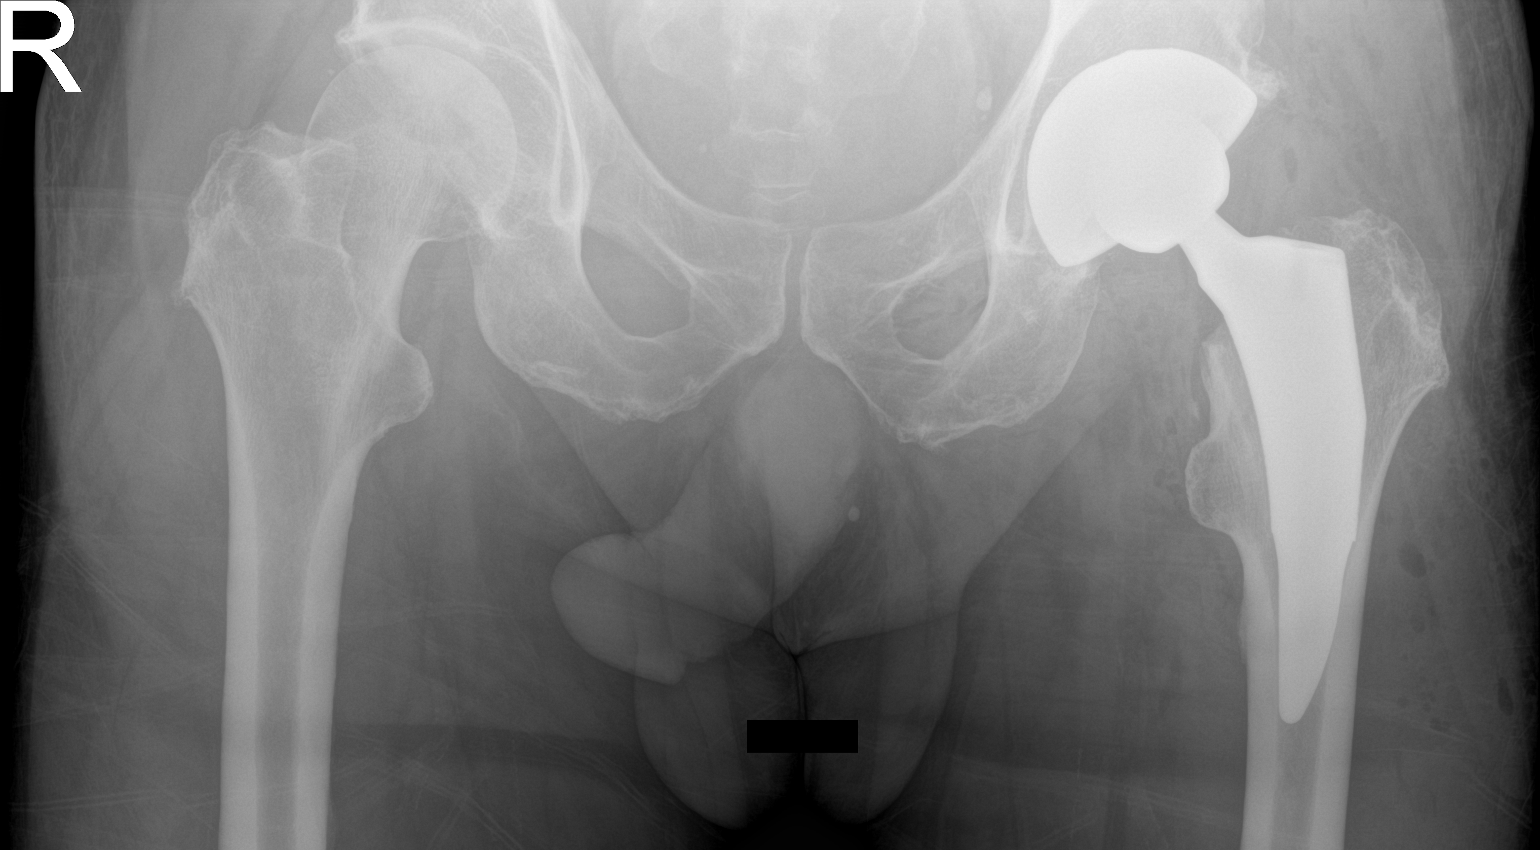

[1 of 1 positions shown; findings below may reference images not displayed]

FINDINGS: Fluoroscopy time insert minutes 20 seconds. Two spot fluoroscopic
nondiagnostic intraoperative radiographs of the left hip demonstrate
postsurgical changes from left total hip arthroplasty.

Single frontal portable postoperative radiograph demonstrates
postsurgical changes from left total hip arthroplasty, with
well-positioned left acetabular and left proximal femoral
prostheses, with no evidence of hardware fracture or loosening. No
evidence of hip dislocation. No acute osseous fracture. No
suspicious focal osseous lesions. Expected gas surrounding the left
hip joint.
IMPRESSION: Intraoperative fluoroscopic guidance for left total hip
arthroplasty.

Single frontal portable postoperative pelvic radiograph demonstrates
satisfactory immediate postoperative appearance from left total hip
arthroplasty.

## 2020-01-14 ENCOUNTER — Ambulatory Visit (INDEPENDENT_AMBULATORY_CARE_PROVIDER_SITE_OTHER): Payer: No Typology Code available for payment source | Admitting: Allergy & Immunology

## 2020-01-14 ENCOUNTER — Other Ambulatory Visit: Payer: Self-pay

## 2020-01-14 ENCOUNTER — Encounter: Payer: Self-pay | Admitting: Allergy & Immunology

## 2020-01-14 VITALS — BP 138/68 | HR 55 | Temp 97.7°F | Resp 18 | Ht 68.5 in | Wt 205.6 lb

## 2020-01-14 DIAGNOSIS — J310 Chronic rhinitis: Secondary | ICD-10-CM | POA: Diagnosis not present

## 2020-01-14 DIAGNOSIS — R220 Localized swelling, mass and lump, head: Secondary | ICD-10-CM | POA: Diagnosis not present

## 2020-01-14 NOTE — Patient Instructions (Addendum)
1. Chronic rhinitis - We are going to get an environmental allergy panel today.  - We will call you in 1-2 weeks with the results of the testing. - We can make a plan once we have these results.   2. Tongue swelling - We are going to get a repeat alpha gal panel.  - I am also going to get some additional labs to look for weird causes of swelling. - EpiPen is up to date.  3. Return in about 6 weeks (around 02/25/2020).    Please inform us of any Emergency Department visits, hospitalizations, or changes in symptoms. Call us before going to the ED for breathing or allergy symptoms since we might be able to fit you in for a sick visit. Feel free to contact us anytime with any questions, problems, or concerns.  It was a pleasure to meet you today!  Websites that have reliable patient information: 1. American Academy of Asthma, Allergy, and Immunology: www.aaaai.org 2. Food Allergy Research and Education (FARE): foodallergy.org 3. Mothers of Asthmatics: http://www.asthmacommunitynetwork.org 4. American College of Allergy, Asthma, and Immunology: www.acaai.org   COVID-19 Vaccine Information can be found at: PodExchange.nl For questions related to vaccine distribution or appointments, please email vaccine@[email or web address] .com or call 8033312142.     Like [place] on [employer] and Instagram for our latest updates!       Make sure you are registered to vote! If you have moved or changed any of your contact information, you will need to get this updated before voting!  In some cases, you MAY be able to register to vote online: [email or web address]

## 2020-01-14 NOTE — Progress Notes (Signed)
NEW PATIENT  Date of Service/Encounter:  01/14/20  Referring provider: Gerome Sam, MD   Assessment:   Chronic rhinitis   Tongue swelling  Alpha gal syndrome  Plan/Recommendations:   1. Chronic rhinitis - We are going to get an environmental allergy panel today.  - We will call you in 1-2 weeks with the results of the testing. - We can make a plan once we have these results.   2. Tongue swelling - We are going to get a repeat alpha gal panel.  - I am also going to get some additional labs to look for weird causes of swelling. - EpiPen is up to date.  3. Return in about 6 weeks (around 02/25/2020).   Subjective:   Eddie Walter is a 79 y.o. male presenting today for evaluation of  Chief Complaint  Patient presents with  . Allergy Testing    Eddie Walter has a history of the following: Patient Active Problem List   Diagnosis Date Noted  . Primary osteoarthritis of left hip 06/03/2017  . Hyperlipidemia 01/10/2011  . 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 Vitamin D deficiency 01/10/2011  . Routine health maintenance 01/10/2011    History obtained from: chart review and patient.  Eddie Walter was referred by Eddie Sam, MD.     Eddie Walter is a 79 y.o. male presenting for an evaluation of environmental allergies and tongue swelling.  He started having episoes of tongue swelling in 2016 or so. He was given an EpiPen which he has used with improvement in the symptoms. Then he went to see Eddie Walter and he wsa given mredication, it semes to bre cetirizine 10mg  BID.   He also started having issues when he was raking leaves. He was actually mowing them the first time and then blowing the second time. He had tongue swelling both of these time. The first time it was on the right side. Then iut started swelling and continued swelling, so he gave himself tepinephrin and it got better.   Allergic Rhinitis Symptom History:  He is on the fluticasone one spray per nostril as needed. He also has the cetirizine to use as needed. He does not require antibiotics often at all.   Food Allergy Symptom History: He apparently was doiagnosed with alpha gal. HE continues to avoid red meats. He never had issues with milk or ice cream. He continue sto eat and drink it. He has never reacted to gelatin but he avoids it nonetheless. He has had a positive alpha gal test; we have some from the [place] system that is fairly low (less than 1). This was on a while ago. He has not had any problems with any other foods at all.   Otherwise, there is no history of other atopic diseases, including drug allergies, stinging insect allergies, eczema, urticaria or contact dermatitis. There is no significant infectious history. Vaccinations are up to date.    Past Medical History: Patient Active Problem List   Diagnosis Date Noted  . Primary osteoarthritis of left hip 06/03/2017  . Hyperlipidemia 01/10/2011  . 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 Vitamin D deficiency 01/10/2011  . Routine health maintenance 01/10/2011    Medication List:  Allergies as of 01/14/2020      Reactions   Gelatin (bovine) [beef (bovine) Protein] Anaphylaxis   RED MEAT BY PRODUCT ALPHA-GAL ALLERGY   Other Anaphylaxis   # # # RED MEAT # # #  ALPHA-GAL [FROM TICK  BITE]      Medication List       Accurate as of January 14, 2020 11:59 PM. If you have any questions, ask your nurse or doctor.        amoxicillin 500 MG capsule Commonly known as: AMOXIL Take 2,000 mg by mouth See admin instructions. 1 hour prior to dental appt   aspirin 81 MG chewable tablet Chew 1 tablet (81 mg total) by mouth 2 (two) times daily.   atorvastatin 40 MG tablet Commonly known as: LIPITOR Take 40 mg by mouth at bedtime.   cetirizine 10 MG tablet Commonly known as: ZYRTEC Take 10 mg by mouth 2 (two) times daily.   doxycycline 100 MG tablet Commonly known as:  VIBRA-TABS doxycycline hyclate 100 mg tablet  TAKE 1 TABLET BY MOUTH TWICE DAILY   EPINEPHrine 0.3 mg/0.3 mL Soaj injection Commonly known as: EPI-PEN Inject 0.3 mg into the muscle as needed for anaphylaxis.   fluticasone 50 MCG/ACT nasal spray Commonly known as: FLONASE Place 2 sprays into both nostrils daily as needed for allergies or rhinitis.   HYDROcodone-acetaminophen 5-325 MG tablet Commonly known as: NORCO/VICODIN Take 1-2 tablets by mouth every 6 (six) hours as needed (hip pain).   senna 8.6 MG Tabs tablet Commonly known as: SENOKOT Take 1 tablet (8.6 mg total) by mouth 2 (two) times daily.   tamsulosin 0.4 MG Caps capsule Commonly known as: FLOMAX Take 1 capsule (0.4 mg total) by mouth daily. What changed: when to take this       Birth History: non-contributory  Developmental History: non-contributory  Past Surgical History: Past Surgical History:  Procedure Laterality Date  . CIRCUMCISION     adult @ 37  . EYE SURGERY  1992   cataract -bilateral  . 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 Heart disease Mother        CAD, bradycardia, PVD - leg amputation  . Asthma Mother   . Kidney disease Mother        nephrolithiasis  . Cancer Father        lung  . Cancer Sister        breast cancer - lumpectomy  . Heart disease Brother        aneurysm  . Heart disease Brother        CAD/MI-fatal  . Diabetes Neg Hx      Social History: Eddie Walter lives at home with his family.  He lives in a house that is also [address].  There are hardwoods in the main living areas and carpeting in the bedroom.  He has electric heating and heat pump for cooling.  There is a dog inside of the home.  There are no dust mite covers on the bedding.  There is no  tobacco exposure.  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  He is not exposed to fumes, chemicals, or dust.  He is a never smoker. 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   Review of Systems  Constitutional: Negative for chills, fever, malaise/fatigue and weight loss.  HENT: Negative.  Negative for congestion, ear discharge and ear pain.   Eyes: Negative for pain, discharge and redness.  Respiratory: Negative for cough,  sputum production, shortness of breath and wheezing.   Cardiovascular: Negative.  Negative for chest pain and palpitations.  Gastrointestinal: Negative for abdominal pain, constipation, diarrhea, heartburn, nausea and vomiting.  Skin: Negative.  Negative for itching and rash.       Positive for tongue swelling.  Neurological: Negative for dizziness and headaches.  Endo/Heme/Allergies: Negative for environmental allergies. Does not bruise/bleed easily.       Objective:   Blood pressure 138/68, pulse (!) 55, temperature 97.7 F (36.5 C), temperature source Temporal, resp. rate 18, height 5' 8.5" (1.74 m), weight 205 lb 9.6 oz (93.3 kg), SpO2 97 %. Body mass index is 30.81 kg/m.   Physical Exam:   Physical Exam Constitutional:      Appearance: He is well-developed.     Comments: Talkative male. Cooperative with the exam.   HENT:     Head: Normocephalic and atraumatic.     Right Ear: Tympanic membrane, ear canal and external ear normal. No drainage, swelling or tenderness. Tympanic membrane is not injected, scarred, erythematous, retracted or bulging.     Left Ear: Tympanic membrane, ear canal and external ear normal. No drainage, swelling or tenderness. Tympanic membrane is not injected, scarred, erythematous, retracted or bulging.     Nose: No nasal deformity, septal deviation, mucosal edema, rhinorrhea or epistaxis.     Right Turbinates: Enlarged and swollen.     Left Turbinates: Enlarged and swollen.     Right Sinus: No  maxillary sinus tenderness or frontal sinus tenderness.     Left Sinus: No maxillary sinus tenderness or frontal sinus tenderness.     Mouth/Throat:     Mouth: Oropharynx is clear and moist. Mucous membranes are not pale and not dry.     Pharynx: Uvula midline.  Eyes:     General:        Right eye: No discharge.        Left eye: No discharge.     Extraocular Movements: EOM normal.     Conjunctiva/sclera: Conjunctivae normal.     Right eye: Right conjunctiva is not injected. No chemosis.    Left eye: Left conjunctiva is not injected. No chemosis.    Pupils: Pupils are equal, round, and reactive to light.  Cardiovascular:     Rate and Rhythm: Normal rate and regular rhythm.     Heart sounds: Normal heart sounds.  Pulmonary:     Effort: Pulmonary effort is normal. No tachypnea, accessory muscle usage or respiratory distress.     Breath sounds: Normal breath sounds. No wheezing, rhonchi or rales.     Comments: Moving air well in all lung fields. Chest:     Chest wall: No tenderness.  Abdominal:     Tenderness: There is no abdominal tenderness. There is no guarding or rebound.  Lymphadenopathy:     Head:     Right side of head: No submandibular, tonsillar or occipital adenopathy.     Left side of head: No submandibular, tonsillar or occipital adenopathy.     Cervical: No cervical adenopathy.  Skin:    Coloration: Skin is not pale.     Findings: No abrasion, erythema, petechiae or rash. Rash is not papular, urticarial or vesicular.  Neurological:     Mental Status: He is alert.  Psychiatric:        Mood and Affect: Mood and affect normal.        Behavior: Behavior is cooperative.      Diagnostic studies: labs sent instead  Jasman Murri, MD [facility]      

## 2020-01-15 LAB — C3 AND C4: Complement C3, Serum: 131 mg/dL (ref 82–167)

## 2020-01-15 LAB — ALLERGENS W/COMP RFLX AREA 2

## 2020-01-15 LAB — ALLERGEN STINGING INSECT PANEL

## 2020-01-16 ENCOUNTER — Encounter: Payer: Self-pay | Admitting: Allergy & Immunology

## 2020-01-18 LAB — ALPHA-GAL PANEL
Allergen Lamb IgE: 0.21 kU/L — AB
Beef IgE: 0.83 kU/L — AB
IgE (Immunoglobulin E), Serum: 18 IU/mL (ref 6–495)
O215-IgE Alpha-Gal: 2.18 kU/L — AB
Pork IgE: 0.36 kU/L — AB

## 2020-01-18 LAB — ALLERGENS W/COMP RFLX AREA 2
Cockroach, German IgE: 0.1 kU/L — AB
Cottonwood IgE: <0.1 kU/L
E005-IgE Dog Dander: 0.11 kU/L — AB
IgE (Immunoglobulin E), Serum: 19 IU/mL (ref 6–495)
Oak, White IgE: <0.1 kU/L
Pecan, Hickory IgE: <0.1 kU/L
Penicillium Chrysogen IgE: <0.1 kU/L

## 2020-01-19 LAB — ALLERGENS W/COMP RFLX AREA 2
Bermuda Grass IgE: <0.1 kU/L
D Pteronyssinus IgE: 0.14 kU/L — AB
E001-IgE Cat Dander: <0.1 kU/L
Sheep Sorrel IgE Qn: <0.1 kU/L

## 2020-01-19 LAB — ALLERGEN STINGING INSECT PANEL: Hornet, White Face, IgE: <0.1 kU/L

## 2020-01-20 LAB — C3 AND C4: Complement C4, Serum: 20 mg/dL (ref 12–38)

## 2020-01-20 LAB — ALLERGENS W/COMP RFLX AREA 2
Alternaria Alternata IgE: <0.1 kU/L
Aspergillus Fumigatus IgE: <0.1 kU/L
Cedar, Mountain IgE: <0.1 kU/L
Cladosporium Herbarum IgE: <0.1 kU/L
Common Silver Birch IgE: <0.1 kU/L
D Farinae IgE: 0.12 kU/L — AB
Elm, American IgE: <0.1 kU/L
Johnson Grass IgE: <0.1 kU/L
Maple/Box Elder IgE: <0.1 kU/L
Mouse Urine IgE: <0.1 kU/L
Pigweed, Rough IgE: <0.1 kU/L
Ragweed, Short IgE: <0.1 kU/L
Timothy Grass IgE: <0.1 kU/L

## 2020-01-20 LAB — C1 ESTERASE INHIBITOR: C1INH SerPl-mCnc: 27 mg/dL (ref 21–39)

## 2020-01-20 LAB — TRYPTASE: Tryptase: 6.1 ug/L (ref 2.2–13.2)

## 2020-01-20 LAB — ALLERGEN STINGING INSECT PANEL
Honeybee IgE: <0.1 kU/L
Paper Wasp IgE: <0.1 kU/L
Yellow Jacket, IgE: <0.1 kU/L

## 2020-01-20 LAB — COMPLEMENT COMPONENT C1Q: Complement C1Q: 11.7 mg/dL (ref 10.2–20.3)

## 2020-01-20 LAB — C1 ESTERASE INHIBITOR, FUNCTIONAL: C1INH Functional/C1INH Total MFr SerPl: 86 %mean normal

## 2020-02-08 ENCOUNTER — Telehealth: Payer: Self-pay | Admitting: Allergy & Immunology

## 2020-02-08 NOTE — Telephone Encounter (Signed)
Patient had another allergic reaction on Saturday and had to use Epi because of tongue swelling. Patient states he did not eat any red meat/ or anything with a hoof. Patient went to the ER and was given 2 allergy shots, a shot of benadryl and 6 days worth of benadryl. Patient would like to know what is causing these episodes.  Patient was told to call the office when he had another reaction to determine what to do next.  Please advise.

## 2020-02-09 NOTE — Telephone Encounter (Signed)
We could try starting Xolair as an immunomodulatory agent to calm down his immune system. Alternatively, we could increase his baseline antihistamines if he is interested in trying that first (doubling current dose). I would also make sure that he understands cross contamination. We could also try avoiding dairy products to see if this helps.  Salvatore Marvel, MD [facility] [place] [place]

## 2020-02-09 NOTE — Telephone Encounter (Signed)
Patient was notified to avoid dairy prior to our conversation ending.

## 2020-02-09 NOTE — Telephone Encounter (Signed)
In that case, I would DEFINITELY avoid dairy. Also please wish him a belated happy birthday.   Salvatore Marvel, MD [facility] [place]

## 2020-02-09 NOTE — Telephone Encounter (Signed)
Patient had a glass of milk and Ice cream prior to his allergic reaction., stating tongue starts to swell. Patient will try to double his antihistamine when he has a reactions. He is not concern of any hives or itching and states it is probably the cross contamination.

## 2020-03-01 ENCOUNTER — Ambulatory Visit: Payer: Medicare Other | Admitting: Allergy & Immunology

## 2020-03-01 ENCOUNTER — Other Ambulatory Visit: Payer: Self-pay

## 2020-03-01 ENCOUNTER — Ambulatory Visit (INDEPENDENT_AMBULATORY_CARE_PROVIDER_SITE_OTHER): Payer: No Typology Code available for payment source | Admitting: Allergy & Immunology

## 2020-03-01 ENCOUNTER — Encounter: Payer: Self-pay | Admitting: Allergy & Immunology

## 2020-03-01 VITALS — BP 130/74 | HR 58 | Temp 97.6°F | Resp 18 | Ht 70.0 in | Wt 204.6 lb

## 2020-03-01 DIAGNOSIS — R220 Localized swelling, mass and lump, head: Secondary | ICD-10-CM | POA: Diagnosis not present

## 2020-03-01 DIAGNOSIS — T7800XD Anaphylactic reaction due to unspecified food, subsequent encounter: Secondary | ICD-10-CM

## 2020-03-01 DIAGNOSIS — J3089 Other allergic rhinitis: Secondary | ICD-10-CM | POA: Diagnosis not present

## 2020-03-01 NOTE — Patient Instructions (Addendum)
1. Chronic rhinitis (dust mites, dogs, cockroach) with angioedema and alpha gal - EpiPen is up to date.  - Continue to avoid milk and dairy.  - We can recheck your alpha gal panel. - Continue with your antihistamine twice daily. - Use double the amounts during swelling flares. - Use Benadryl if this does not work. - Use the EpiPen if needed if this does not work.  3. Return in about 1 year (around 03/01/2021).    Please inform us of any Emergency Department visits, hospitalizations, or changes in symptoms. Call us before going to the ED for breathing or allergy symptoms since we might be able to fit you in for a sick visit. Feel free to contact us anytime with any questions, problems, or concerns.  It was a pleasure to see you again today!  Websites that have reliable patient information: 1. American Academy of Asthma, Allergy, and Immunology: www.aaaai.org 2. Food Allergy Research and Education (FARE): foodallergy.org 3. Mothers of Asthmatics: http://www.asthmacommunitynetwork.org 4. American College of Allergy, Asthma, and Immunology: www.acaai.org   COVID-19 Vaccine Information can be found at: ShippingScam.co.uk For questions related to vaccine distribution or appointments, please email vaccine@[email or web address] or call [phone].   We realize that you might be concerned about having an allergic reaction to the COVID19 vaccines. To help with that concern, WE ARE OFFERING THE COVID19 VACCINES IN OUR OFFICE! Ask the front desk for dates!     "Like" [place] on Facebook and Instagram for our latest updates!      A healthy democracy works best when [employer] participate! Make sure you are registered to vote! If you have moved or changed any of your contact information, you will need to get this updated before voting!  In some cases, you MAY be able to register to vote online:  [email or web address]

## 2020-03-01 NOTE — Progress Notes (Signed)
FOLLOW UP  Date of Service/Encounter:  03/01/20   Assessment:   Chronic rhinitis (dust mite, dog, cockroach) - with minimal symptoms  Tongue swelling - improved with avoidance of dairy (likely cross-reactivity with alpha gal) Affect is terrible when she is sick Alpha gal syndrome  Plan/Recommendations:   1. Chronic rhinitis (dust mites, dogs, cockroach) with angioedema and alpha gal - EpiPen is up to date.  - Continue to avoid milk and dairy.  - We can recheck your alpha gal panel. - Continue with your antihistamine twice daily. - Use double the amounts during swelling flares. - Use Benadryl if this does not work. - Use the EpiPen if needed if this does not work.  3. Return in about 1 year (around 03/01/2021).   Subjective:   Eddie Walter is a 80 y.o. male presenting today for follow up of  Chief Complaint  Patient presents with  . Allergic Rhinitis     No issues and no refills needed today     Eddie Walter has a history of the following: Patient Active Problem List   Diagnosis Date Noted  . Primary osteoarthritis of left hip 06/03/2017  . Hyperlipidemia 01/10/2011  . 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 Vitamin D deficiency 01/10/2011  . Routine health maintenance 01/10/2011    History obtained from: chart review and patient.  Eddie Walter is a 80 y.o. male presenting for a follow up visit.  He was last seen as a new patient in December 2021.  At that time, we obtained an environmental allergy panel that was positive to dust mites, dog, and cockroach (although these were all very low).  For his tongue swelling, we got a repeat alpha gal panel which actually showed a higher IgE than his first 1.  Work-up for hereditary angioedema was negative.  Since the last visit, he has mostly done well. He is avoiding red meats since his testing was negative at the last visit. He has not had a spell in four or five weeks. This is fairly good for him.   He  did double up on his cetirizine. This finally improved with Benadryl. He stopped drinking milk and ice cream. He is not reading labels and avoiding all milk. He has not had to use epinephrine. He is fine without using red meat. It does not bother him at all.   He does have a dog at home. He has had the dog for 8 years. He denies any allergic rhinitis symptoms including rhinorrhea, postnasal drip, and eye itching. He does not think that he is having any reactions to his dog.    Otherwise, there have been no changes to his past medical history, surgical history, family history, or social history.    Review of Systems  Constitutional: Negative.  Negative for chills, fever, malaise/fatigue and weight loss.  HENT: Negative for congestion, ear discharge, ear pain, sinus pain and sore throat.   Eyes: Negative for pain, discharge and redness.  Respiratory: Negative for cough, sputum production, shortness of breath and wheezing.   Cardiovascular: Negative.  Negative for chest pain and palpitations.  Gastrointestinal: Negative for abdominal pain, constipation, diarrhea, heartburn, nausea and vomiting.  Skin: Positive for itching. Negative for rash.  Neurological: Negative for dizziness and headaches.  Endo/Heme/Allergies: Positive for environmental allergies. Does not bruise/bleed easily.       Objective:   Blood pressure 130/74, pulse (!) 58, temperature 97.6 F (36.4 C), resp. rate 18, height 5\' 10"  (1.778 m),  weight 204 lb 9.6 oz (92.8 kg), SpO2 97 %. Body mass index is 29.36 kg/m.   Physical Exam:  Physical Exam Constitutional:      Appearance: He is well-developed.     Comments: Very nice. Somewhat hard of hearing.   HENT:     Head: Normocephalic and atraumatic.     Right Ear: Tympanic membrane, ear canal and external ear normal.     Left Ear: Tympanic membrane, ear canal and external ear normal.     Nose: No nasal deformity, septal deviation, mucosal edema, rhinorrhea or  epistaxis.     Right Turbinates: Not enlarged or swollen.     Left Turbinates: Not enlarged or swollen.     Right Sinus: No maxillary sinus tenderness or frontal sinus tenderness.     Left Sinus: No maxillary sinus tenderness or frontal sinus tenderness.     Mouth/Throat:     Mouth: Oropharynx is clear and moist. Mucous membranes are not pale and not dry.     Pharynx: Uvula midline.  Eyes:     General:        Right eye: No discharge.        Left eye: No discharge.     Extraocular Movements: EOM normal.     Conjunctiva/sclera: Conjunctivae normal.     Right eye: Right conjunctiva is not injected. No chemosis.    Left eye: Left conjunctiva is not injected. No chemosis.    Pupils: Pupils are equal, round, and reactive to light.  Cardiovascular:     Rate and Rhythm: Normal rate and regular rhythm.     Heart sounds: Normal heart sounds.  Pulmonary:     Effort: Pulmonary effort is normal. No tachypnea, accessory muscle usage or respiratory distress.     Breath sounds: Normal breath sounds. No wheezing, rhonchi or rales.     Comments: Moving air well in all lung fields. No increased work of breathing noted.  Chest:     Chest wall: No tenderness.  Lymphadenopathy:     Cervical: No cervical adenopathy.  Skin:    General: Skin is warm.     Capillary Refill: Capillary refill takes less than 2 seconds.     Coloration: Skin is not pale.     Findings: No abrasion, erythema, petechiae or rash. Rash is not papular, urticarial or vesicular.     Comments: No eczematous or urticarial lesions noted.   Neurological:     Mental Status: He is alert.  Psychiatric:        Mood and Affect: Mood and affect normal.        Behavior: Behavior is cooperative.      Diagnostic studies: none     Eddie Marvel, MD  [facility] [place]

## 2020-12-26 ENCOUNTER — Telehealth: Payer: Self-pay | Admitting: Allergy & Immunology

## 2020-12-26 NOTE — Telephone Encounter (Signed)
I have faxed renewal of authorization request to [facility], [phone] as well as emailed to Nurse Shelton Silvas ([email or web address]) and Nurse Gold ([email or web address])

## 2021-01-25 NOTE — Telephone Encounter (Signed)
Reached out to [facility], [phone] to check on renewal authorization request. I spoke to Otila Kluver who let me know authorization had been processed and approved. Otila Kluver confirmed patient's next appointment which is set for 02-28-2021.   Otila Kluver stated new authorization [id number]) has the dates of 02-28-2021 to 02-28-2022.   I requested she fax authorization to me, I received authorization and have updated it in system.

## 2021-02-28 ENCOUNTER — Ambulatory Visit: Payer: Medicare Other | Admitting: Allergy and Immunology

## 2021-03-29 ENCOUNTER — Other Ambulatory Visit: Payer: Self-pay

## 2021-03-29 ENCOUNTER — Encounter: Payer: Self-pay | Admitting: Allergy and Immunology

## 2021-03-29 ENCOUNTER — Ambulatory Visit (INDEPENDENT_AMBULATORY_CARE_PROVIDER_SITE_OTHER): Payer: No Typology Code available for payment source | Admitting: Allergy and Immunology

## 2021-03-29 VITALS — BP 140/82 | HR 61 | Resp 16 | Ht 70.0 in | Wt 206.8 lb

## 2021-03-29 DIAGNOSIS — J3089 Other allergic rhinitis: Secondary | ICD-10-CM | POA: Diagnosis not present

## 2021-03-29 DIAGNOSIS — T7800XD Anaphylactic reaction due to unspecified food, subsequent encounter: Secondary | ICD-10-CM
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
## 2021-03-29 NOTE — Patient Instructions (Addendum)
?  1.  Continue mammal consumption avoidance ? ?2.  Continue EpiPen, Benadryl, MD/ER evaluation for allergic reaction ? ?3.  Continue OTC antihistamine if needed ? ?4.  Return to clinic in 1 year or earlier if problem ?

## 2021-03-30 ENCOUNTER — Encounter: Payer: Self-pay | Admitting: Allergy and Immunology

## 2021-07-31 ENCOUNTER — Encounter: Payer: Self-pay | Admitting: Allergy and Immunology

## 2021-07-31 ENCOUNTER — Ambulatory Visit (INDEPENDENT_AMBULATORY_CARE_PROVIDER_SITE_OTHER): Payer: No Typology Code available for payment source | Admitting: Allergy and Immunology

## 2021-07-31 VITALS — BP 150/78 | HR 71 | Resp 16

## 2021-07-31 DIAGNOSIS — J3089 Other allergic rhinitis: Secondary | ICD-10-CM | POA: Diagnosis not present

## 2021-07-31 DIAGNOSIS — T7800XD Anaphylactic reaction due to unspecified food, subsequent encounter: Secondary | ICD-10-CM

## 2021-07-31 DIAGNOSIS — T783XXD Angioneurotic edema, subsequent encounter: Secondary | ICD-10-CM

## 2021-07-31 MED ORDER — MONTELUKAST SODIUM 10 MG PO TABS: 10.0000 mg | ORAL_TABLET | Freq: Every day | ORAL | 5 refills | Status: DC

## 2021-07-31 MED ORDER — FAMOTIDINE 20 MG PO TABS: 20.0000 mg | ORAL_TABLET | Freq: Two times a day (BID) | ORAL | 5 refills | Status: DC
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
## 2021-07-31 NOTE — Patient Instructions (Addendum)
  1.  Continue mammal consumption avoidance  2.  Continue EpiPen, Benadryl, MD/ER evaluation for allergic reaction  3.  Use the following every day:  A. Cetirizine 10 mg - 1 tablet 2 time per day B. Famotidine 20 mg - 1 tablet 2 times per day C. Montelukast 10 mg - 1 tablet 1 time per day  4.  Return to clinic in 8 weeks or earlier if problem

## 2021-08-01 ENCOUNTER — Encounter: Payer: Self-pay | Admitting: Allergy and Immunology

## 2021-10-04 ENCOUNTER — Ambulatory Visit (INDEPENDENT_AMBULATORY_CARE_PROVIDER_SITE_OTHER): Payer: No Typology Code available for payment source | Admitting: Allergy and Immunology

## 2021-10-04 ENCOUNTER — Encounter: Payer: Self-pay | Admitting: Allergy and Immunology

## 2021-10-04 VITALS — BP 128/60 | HR 60 | Resp 16 | Ht 69.0 in | Wt 210.2 lb

## 2021-10-04 DIAGNOSIS — T7800XD Anaphylactic reaction due to unspecified food, subsequent encounter: Secondary | ICD-10-CM

## 2021-10-04 DIAGNOSIS — T783XXD Angioneurotic edema, subsequent encounter: Secondary | ICD-10-CM
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
## 2021-10-04 NOTE — Patient Instructions (Addendum)
  1.  Continue mammal consumption avoidance  2.  Continue EpiPen, Benadryl, MD/ER evaluation for allergic reaction  3.  Use the following every day:  A. Cetirizine 10 mg - 1 tablet 1-2 time per day B. Famotidine 20 mg - 1 tablet 1-2 times per day C. Montelukast 10 mg - 1 tablet 1 time per day  4. Check alpha gal panel (last # = 2.18 in December 2021)  5. Return to clinic in 1 year or earlier if problem  6. Plan for fall flu vaccine and RSV vaccine.

## 2021-10-05 ENCOUNTER — Encounter: Payer: Self-pay | Admitting: Allergy and Immunology

## 2021-11-03 LAB — ALPHA-GAL PANEL
Allergen Lamb IgE: 1.48 kU/L — AB
Beef IgE: 1.91 kU/L — AB
IgE (Immunoglobulin E), Serum: 34 IU/mL (ref 6–495)

## 2021-11-04 LAB — ALPHA-GAL PANEL: Pork IgE: 1.35 kU/L — AB

## 2021-12-04 ENCOUNTER — Encounter: Payer: Medicare Other | Attending: Internal Medicine | Admitting: Dietician

## 2021-12-04 DIAGNOSIS — E119 Type 2 diabetes mellitus without complications: Secondary | ICD-10-CM | POA: Diagnosis not present

## 2021-12-04 NOTE — Progress Notes (Signed)
Diabetes Self-Management Education  Visit Type: First/Initial  Appt. Start Time: 1355 Appt. End Time: 9892  12/04/2021  Eddie Walter, identified by name and date of birth, is a 81 y.o. male with a diagnosis of Diabetes: Type 2.   ASSESSMENT  Primary concern: Pt has recent diagnosis of diabetes x1 month ago and wants to learn about nutrition and diabetes.   History includes: arthritis, cancer, HLD.  Labs noted: 10/11/21: A1c 6.9% Medications include: metformin Supplements: vitamin B12 Food allergies: alpha-gal syndrome: beef, pork.  Patient lives with wife. Pt cooks his own breakfast and lunch and wife cooks dinner.    Pt has had hip replacement. He goes to gym 3 days a week and does 15 minutes of cardio and 30-45 minutes weights. Pt states he may have to have knee replacement.   Pt has 2 cups coffee with breakfast, 1 cup after lunch, 1 cup after dinner. Pt drinks around 16 oz of water daily.   Pt goes to bed at 9:30pm. Pt has dinner at 4:45pm and gets hungry before bed so pt has cereal and bananas.   Pt states they eat at home more often than going out. Pt has not had red meat or pork in 6 years due to alpha gal.   Pt has concerns that he will someday be on insulin. Pt wants to prevent this from happening. Pt has family history with siblings who had diabetes and one was on insulin.  There were no vitals taken for this visit. There is no height or weight on file to calculate BMI.   Diabetes Self-Management Education - 12/04/21 1358       Visit Information   Visit Type First/Initial      Initial Visit   Diabetes Type Type 2    Date Diagnosed 10/2021    Are you currently following a meal plan? No    Are you taking your medications as prescribed? Not on Medications      Health Coping   How would you rate your overall health? Good      Psychosocial Assessment   Patient Belief/Attitude about Diabetes Motivated to manage diabetes    What is the hardest part about  your diabetes right now, causing you the most concern, or is the most worrisome to you about your diabetes?   Making healty food and beverage choices    Self-care barriers None    Self-management support Doctor's office    Other persons present Patient    Patient Concerns Healthy Lifestyle    Special Needs None    Preferred Learning Style No preference indicated    Learning Readiness Ready    How often do you need to have someone help you when you read instructions, pamphlets, or other written materials from your doctor or pharmacy? 1 - Never    What is the last grade level you completed in school? bachelors degree      Pre-Education Assessment   Patient understands the diabetes disease and treatment process. Needs Instruction    Patient understands incorporating nutritional management into lifestyle. Needs Instruction    Patient undertands incorporating physical activity into lifestyle. Needs Instruction    Patient understands using medications safely. Needs Instruction    Patient understands monitoring blood glucose, interpreting and using results Needs Instruction    Patient understands prevention, detection, and treatment of acute complications. Needs Instruction    Patient understands prevention, detection, and treatment of chronic complications. Needs Instruction    Patient understands how to  develop strategies to address psychosocial issues. Needs Instruction    Patient understands how to develop strategies to promote health/change behavior. Needs Instruction      Complications   Last HgB A1C per patient/outside source 6.9 %    How often do you check your blood sugar? 1-2 times/day    Fasting Blood glucose range (mg/dL) 70-129;130-179    Have you had a dilated eye exam in the past 12 months? Yes    Have you had a dental exam in the past 12 months? Yes    Are you checking your feet? Yes    How many days per week are you checking your feet? 7      Dietary Intake   Breakfast 2  [place] sausages with cheese and an egg OR cereal with milk    Snack (morning) none OR roasted peanuts    Lunch leftovers OR cottage cheese with fruit and vegetables OR cheese and crackers OR [place] dog in bun with cole slaw    Snack (afternoon) 2pm: apple OR cheese and crackers OR peanuts OR sugar free oatmeal cookies    Dinner 5pm: chicken with green beans and salad and baked potato OR spaghetti with [place] sauce OR [place] dog with bun and salad and baked potato    Snack (evening) cereal and banana OR none OR cookie    Beverage(s) 4 cups coffee, 32 oz water      Activity / Exercise   Activity / Exercise Type Light (walking / raking leaves)    How many days per week do you exercise? 3    How many minutes per day do you exercise? 45    Total minutes per week of exercise 135      Patient Education   Previous Diabetes Education No    Disease Pathophysiology Definition of diabetes, type 1 and 2, and the diagnosis of diabetes;Factors that contribute to the development of diabetes;Explored patient's options for treatment of their diabetes    Healthy Eating Role of diet in the treatment of diabetes and the relationship between the three main macronutrients and blood glucose level;Food label reading, portion sizes and measuring food.;Plate Method;Reviewed blood glucose goals for pre and post meals and how to evaluate the patients' food intake on their blood glucose level.;Effects of alcohol on blood glucose and safety factors with consumption of alcohol.;Meal timing in regards to the patients' current diabetes medication.;Meal options for control of blood glucose level and chronic complications.    Being Active Role of exercise on diabetes management, blood pressure control and cardiac health.;Helped patient identify appropriate exercises in relation to his/her diabetes, diabetes complications and other health issue.    Medications Reviewed patients medication for diabetes, action, purpose, timing of dose  and side effects.    Monitoring Identified appropriate SMBG and/or A1C goals.;Yearly dilated eye exam;Daily foot exams    Acute complications Taught prevention, symptoms, and  treatment of hypoglycemia - the 15 rule.;Discussed and identified patients' prevention, symptoms, and treatment of hyperglycemia.;Educated on sick day management    Chronic complications Relationship between chronic complications and blood glucose control;Assessed and discussed foot care and prevention of foot problems;Lipid levels, blood glucose control and heart disease;Identified and discussed with patient  current chronic complications;Dental care;Retinopathy and reason for yearly dilated eye exams;Nephropathy, what it is, prevention of, the use of ACE, ARB's and early detection of through urine microalbumia.;Reviewed with patient heart disease, higher risk of, and prevention    Diabetes Stress and Support Identified and addressed patients feelings  and concerns about diabetes;Worked with patient to identify barriers to care and solutions;Role of stress on diabetes    Lifestyle and Health Coping Lifestyle issues that need to be addressed for better diabetes care      Individualized Goals (developed by patient)   Nutrition General guidelines for healthy choices and portions discussed    Physical Activity Exercise 3-5 times per week;45 minutes per day    Medications take my medication as prescribed    Monitoring  Test my blood glucose as discussed    Problem Solving Eating Pattern    Reducing Risk examine blood glucose patterns;do foot checks daily;treat hypoglycemia with 15 grams of carbs if blood glucose less than '70mg'$ /dL    Health Coping Ask for help with psychological, social, or emotional issues      Post-Education Assessment   Patient understands the diabetes disease and treatment process. Comprehends key points    Patient understands incorporating nutritional management into lifestyle. Comprehends key points     Patient undertands incorporating physical activity into lifestyle. Demonstrates understanding / competency    Patient understands using medications safely. Demonstrates understanding / competency    Patient understands monitoring blood glucose, interpreting and using results Comprehends key points    Patient understands prevention, detection, and treatment of acute complications. Comprehends key points    Patient understands prevention, detection, and treatment of chronic complications. Comprehends key points    Patient understands how to develop strategies to address psychosocial issues. Comprehends key points    Patient understands how to develop strategies to promote health/change behavior. Comprehends key points      Outcomes   Expected Outcomes Demonstrated interest in learning. Expect positive outcomes    Future DMSE 3-4 months    Program Status Not Completed             Individualized Plan for Diabetes Self-Management Training:   Learning Objective:  Patient will have a greater understanding of diabetes self-management. Patient education plan is to attend individual and/or group sessions per assessed needs and concerns.   Plan:   Patient Instructions  Aim for 150 minutes of physical activity weekly. Goal: continue going to the gym 3x/wk, consider increasing to 4.   Eat more Non-Starchy Vegetables Goal: aim to make 1/2 of your plate vegetables at least 2x/day  Minimize added sugars and refined grains. Rethink what you drink. Choose beverages without added sugar. Look for 0 carbs on the label.  Choose whole foods over processed.  Goal: drink 3 of your water bottles per day.  Breakfast: sometimes choose eggs over sausage, and add a fruit like orange or apple.   Have your A1c checked every 3 months by your doctor.   Expected Outcomes:  Demonstrated interest in learning. Expect positive outcomes  Education material provided: ADA - How to Thrive: A Guide for Your  Journey with Diabetes, My Plate, and Snack sheet  If problems or questions, patient to contact team via:  Phone  Future DSME appointment: 3-4 months

## 2021-12-04 NOTE — Patient Instructions (Addendum)
Aim for 150 minutes of physical activity weekly. Goal: continue going to the gym 3x/wk, consider increasing to 4.   Eat more Non-Starchy Vegetables Goal: aim to make 1/2 of your plate vegetables at least 2x/day  Minimize added sugars and refined grains. Rethink what you drink. Choose beverages without added sugar. Look for 0 carbs on the label.  Choose whole foods over processed.  Goal: drink 3 of your water bottles per day.  Breakfast: sometimes choose eggs over sausage, and add a fruit like orange or apple.   Have your A1c checked every 3 months by your doctor.

## 2022-01-29 ENCOUNTER — Other Ambulatory Visit: Payer: Self-pay | Admitting: Allergy and Immunology

## 2022-03-08 ENCOUNTER — Telehealth: Payer: Self-pay | Admitting: Allergy and Immunology

## 2022-03-08 NOTE — Telephone Encounter (Signed)
Patient's current authorization [id number]) is set to expire on 03-29-2022.   Faxed renewal of authorization request to [facility], [phone] and emailed it to [email or web address]

## 2022-04-04 ENCOUNTER — Ambulatory Visit: Payer: BLUE CROSS/BLUE SHIELD | Admitting: Dietician

## 2022-05-15 ENCOUNTER — Encounter: Payer: Medicare Other | Attending: Internal Medicine | Admitting: Dietician

## 2022-05-15 ENCOUNTER — Encounter: Payer: Self-pay | Admitting: Dietician

## 2022-05-15 VITALS — Wt 205.0 lb

## 2022-05-15 DIAGNOSIS — E119 Type 2 diabetes mellitus without complications: Secondary | ICD-10-CM

## 2022-05-15 NOTE — Progress Notes (Signed)
Diabetes Self-Management Education  Visit Type: Follow-up  Appt. Start Time: 1145 Appt. End Time: 1200  05/15/2022  [name], identified by name and date of birth, is a 82 y.o. male with a diagnosis of Diabetes: Type 2  .   ASSESSMENT  History includes: arthritis, cancer, HLD, type 2 diabetes Labs noted: 02/15/2022: A1c 6.7% Medications include: metformin Supplements: vitamin B12 Food allergies: alpha-gal syndrome: beef, pork.  Pt states he got out of his routine but he is working back into it. He states he recently stopped going to the gym 3x/wk because of conflicting appointments and feels that he was making excuses for himself.   Pt reports he checks his blood glucose every morning. He states it was always under 130mg /dL until the last week or so when he saw 133mg /dL and was worried that it was too high.   Pt brought his A1c down from 6.9 to 6.7 from September 2023 to February 2024.   Pt states he has been trying to intermittent fast and eats breakfast at 8am and dinner 4/5pm. He states he does not usually get hungry outside of these windows. Encouraged pt to have a balanced snack if he is hungry after dinner.   Pt increased his water intake from 16 oz to 40 oz daily. Pt will continue to try to work toward 64 oz daily.   Weight 205 lb (93 kg). Body mass index is 30.27 kg/m.   Diabetes Self-Management Education - 05/15/22 1143       Visit Information   Visit Type Follow-up      Health Coping   How would you rate your overall health? Good      Psychosocial Assessment   Patient Belief/Attitude about Diabetes Motivated to manage diabetes    What is the hardest part about your diabetes right now, causing you the most concern, or is the most worrisome to you about your diabetes?   Making healty food and beverage choices    Self-care barriers None    Self-management support Doctor's office    Other persons present Patient    Patient Concerns Nutrition/Meal  planning    Special Needs None    Preferred Learning Style No preference indicated    Learning Readiness Ready      Pre-Education Assessment   Patient understands the diabetes disease and treatment process. Needs Review    Patient understands incorporating nutritional management into lifestyle. Needs Review    Patient undertands incorporating physical activity into lifestyle. Needs Review    Patient understands using medications safely. Needs Review    Patient understands monitoring blood glucose, interpreting and using results Needs Review    Patient understands prevention, detection, and treatment of acute complications. Needs Review    Patient understands prevention, detection, and treatment of chronic complications. Needs Review    Patient understands how to develop strategies to address psychosocial issues. Needs Review    Patient understands how to develop strategies to promote health/change behavior. Needs Review      Complications   Last HgB A1C per patient/outside source 6.7 %    How often do you check your blood sugar? 1-2 times/day    Fasting Blood glucose range (mg/dL) 16-109;604-540      Dietary Intake   Breakfast 1 [place] sausage, 1 scrambled egg, cheese    Snack (morning) peanuts    Lunch sandwich (tuna salad, chicken salad, banana) OR leftovers (meat and 2 veggies)    Snack (afternoon) none    Dinner meat and  2 veggies    Snack (evening) none    Beverage(s) 3 c coffee, 40 oz water      Activity / Exercise   Activity / Exercise Type Light (walking / raking leaves)    How many days per week do you exercise? 1    How many minutes per day do you exercise? 30    Total minutes per week of exercise 30      Patient Education   Previous Diabetes Education Yes (please comment)    Disease Pathophysiology Explored patient's options for treatment of their diabetes    Healthy Eating Plate Method;Reviewed blood glucose goals for pre and post meals and how to evaluate the  patients' food intake on their blood glucose level.;Meal timing in regards to the patients' current diabetes medication.;Information on hints to eating out and maintain blood glucose control.;Meal options for control of blood glucose level and chronic complications.    Being Active Role of exercise on diabetes management, blood pressure control and cardiac health.;Identified with patient nutritional and/or medication changes necessary with exercise.    Medications Reviewed patients medication for diabetes, action, purpose, timing of dose and side effects.    Monitoring Identified appropriate SMBG and/or A1C goals.    Chronic complications Relationship between chronic complications and blood glucose control    Diabetes Stress and Support Identified and addressed patients feelings and concerns about diabetes    Lifestyle and Health Coping Lifestyle issues that need to be addressed for better diabetes care      Individualized Goals (developed by patient)   Nutrition General guidelines for healthy choices and portions discussed    Physical Activity Exercise 3-5 times per week;45 minutes per day    Medications take my medication as prescribed    Monitoring  Test my blood glucose as discussed    Problem Solving Eating Pattern    Reducing Risk examine blood glucose patterns;do foot checks daily;treat hypoglycemia with 15 grams of carbs if blood glucose less than 70mg /dL    Health Coping Ask for help with psychological, social, or emotional issues      Patient Self-Evaluation of Goals - Patient rates self as meeting previously set goals (% of time)   Nutrition 50 - 75 % (half of the time)    Physical Activity 50 - 75 % (half of the time)    Medications >75% (most of the time)    Monitoring >75% (most of the time)    Problem Solving and behavior change strategies  50 - 75 % (half of the time)    Reducing Risk (treating acute and chronic complications) 50 - 75 % (half of the time)    Health Coping 50 -  75 % (half of the time)      Post-Education Assessment   Patient understands the diabetes disease and treatment process. Comprehends key points    Patient understands incorporating nutritional management into lifestyle. Comprehends key points    Patient undertands incorporating physical activity into lifestyle. Comprehends key points    Patient understands using medications safely. Demonstrates understanding / competency    Patient understands monitoring blood glucose, interpreting and using results Demonstrates understanding / competency    Patient understands prevention, detection, and treatment of acute complications. Comprehends key points    Patient understands prevention, detection, and treatment of chronic complications. Comprehends key points    Patient understands how to develop strategies to address psychosocial issues. Comprehends key points    Patient understands how to develop strategies to promote  health/change behavior. Comprehends key points      Outcomes   Expected Outcomes Demonstrated interest in learning. Expect positive outcomes    Future DMSE PRN    Program Status Completed      Subsequent Visit   Since your last visit have you continued or begun to take your medications as prescribed? Yes             Individualized Plan for Diabetes Self-Management Training:   Learning Objective:  Patient will have a greater understanding of diabetes self-management. Patient education plan is to attend individual and/or group sessions per assessed needs and concerns.   Plan:   Patient Instructions   Previous goals: Goal: continue going to the gym 3x/wk, consider increasing to 4. - in progress, continue.   Goal: aim to make 1/2 of your plate vegetables at least 2x/day - in progress, continue.   Goal: drink 3 of your water bottles per day. - in progress, continue.   Breakfast: sometimes choose eggs over sausage, and add a fruit like orange or apple.   Expected  Outcomes:  Demonstrated interest in learning. Expect positive outcomes  Education material provided: No handouts provided on this follow up  If problems or questions, patient to contact team via:  Phone  Future DSME appointment: PRN

## 2022-05-15 NOTE — Patient Instructions (Signed)
  Previous goals: Goal: continue going to the gym 3x/wk, consider increasing to 4. - in progress, continue.   Goal: aim to make 1/2 of your plate vegetables at least 2x/day - in progress, continue.   Goal: drink 3 of your water bottles per day. - in progress, continue.   Breakfast: sometimes choose eggs over sausage, and add a fruit like orange or apple.

## 2022-06-14 NOTE — Telephone Encounter (Signed)
[facility], [phone] to check on status of renewal of services request.  Spoke to Joy  Request was received and passed along to patient's primary care provider. However [facility] has not heard back from provider's office. Joy sent message to provider's office to get an updated on renewal of services request and to have nurse navigator take a look at it.

## 2022-07-31 ENCOUNTER — Other Ambulatory Visit: Payer: Self-pay | Admitting: Allergy and Immunology

## 2022-08-01 ENCOUNTER — Telehealth: Payer: Self-pay | Admitting: Allergy and Immunology

## 2022-08-03 MED ORDER — FAMOTIDINE 20 MG PO TABS: 20.0000 mg | ORAL_TABLET | Freq: Two times a day (BID) | ORAL | 0 refills | Status: AC

## 2022-08-03 NOTE — Telephone Encounter (Signed)
Medication has been sent to requested pharmacy. 

## 2022-08-03 NOTE — Telephone Encounter (Signed)
Patient called requesting refill for famotadine. Patient is completely out.   [facility]  [address] [place]) [place] [address]   Best contact number: [phone]

## 2022-10-04 ENCOUNTER — Ambulatory Visit (INDEPENDENT_AMBULATORY_CARE_PROVIDER_SITE_OTHER): Payer: Medicare Other | Admitting: Allergy and Immunology

## 2022-10-04 ENCOUNTER — Encounter: Payer: Self-pay | Admitting: Allergy and Immunology

## 2022-10-04 VITALS — BP 130/60 | HR 60 | Resp 16 | Ht 68.5 in | Wt 209.0 lb

## 2022-10-04 DIAGNOSIS — T783XXD Angioneurotic edema, subsequent encounter: Secondary | ICD-10-CM | POA: Diagnosis not present

## 2022-10-04 DIAGNOSIS — T7800XD Anaphylactic reaction due to unspecified food, subsequent encounter: Secondary | ICD-10-CM
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
## 2022-10-04 NOTE — Patient Instructions (Signed)
  1.  Continue mammal consumption avoidance  2.  Continue EpiPen, Benadryl, MD/ER evaluation for allergic reaction  3.  Use the following every day:  A. Cetirizine 10 mg - 1 tablet 1-2 time per day B. Famotidine 20 mg - 1 tablet 1-2 times per day C. Montelukast 10 mg - 1 tablet 1 time per day  4. Return to clinic in 1 year or earlier if problem  5. Plan for fall flu vaccine and RSV vaccine.

## 2022-10-08 ENCOUNTER — Encounter: Payer: Self-pay | Admitting: Allergy and Immunology

## 2022-10-11 NOTE — Telephone Encounter (Signed)
Patient's renewal request was denied. Denial letter has been indexed to media.   AMB Correspondence - [facility] DENIAL LETTER 2024

## 2023-10-03 ENCOUNTER — Ambulatory Visit: Payer: Medicare Other | Admitting: Allergy and Immunology
